# Patient Record
Sex: Female | Born: 1945 | Race: White | Hispanic: No | Smoking: Former smoker
Health system: Southern US, Community
[De-identification: ages and names within clinical notes are randomized; demographics above are authoritative.]

## PROBLEM LIST (undated history)

## (undated) DIAGNOSIS — E079 Disorder of thyroid, unspecified: Secondary | ICD-10-CM

## (undated) DIAGNOSIS — E785 Hyperlipidemia, unspecified: Secondary | ICD-10-CM

---

## 2000-04-28 ENCOUNTER — Other Ambulatory Visit: Admission: RE | Admit: 2000-04-28 | Discharge: 2000-04-28 | Payer: Self-pay | Admitting: Family Medicine

## 2000-11-26 ENCOUNTER — Other Ambulatory Visit: Admission: RE | Admit: 2000-11-26 | Discharge: 2000-11-26 | Payer: Self-pay | Admitting: Obstetrics and Gynecology

## 2000-11-29 ENCOUNTER — Other Ambulatory Visit: Admission: RE | Admit: 2000-11-29 | Discharge: 2000-11-29 | Payer: Self-pay | Admitting: Obstetrics and Gynecology

## 2000-11-29 ENCOUNTER — Encounter (INDEPENDENT_AMBULATORY_CARE_PROVIDER_SITE_OTHER): Payer: Self-pay

## 2000-12-08 ENCOUNTER — Encounter: Payer: Self-pay | Admitting: Obstetrics and Gynecology

## 2000-12-08 ENCOUNTER — Encounter: Admission: RE | Admit: 2000-12-08 | Discharge: 2000-12-08 | Payer: Self-pay | Admitting: Obstetrics and Gynecology

## 2001-05-04 ENCOUNTER — Other Ambulatory Visit: Admission: RE | Admit: 2001-05-04 | Discharge: 2001-05-04 | Payer: Self-pay | Admitting: *Deleted

## 2001-05-04 ENCOUNTER — Other Ambulatory Visit: Admission: RE | Admit: 2001-05-04 | Discharge: 2001-05-04 | Payer: Self-pay | Admitting: Obstetrics and Gynecology

## 2001-10-06 ENCOUNTER — Other Ambulatory Visit: Admission: RE | Admit: 2001-10-06 | Discharge: 2001-10-06 | Payer: Self-pay | Admitting: Obstetrics and Gynecology

## 2001-12-12 ENCOUNTER — Encounter: Admission: RE | Admit: 2001-12-12 | Discharge: 2001-12-12 | Payer: Self-pay | Admitting: Obstetrics and Gynecology

## 2001-12-12 ENCOUNTER — Encounter: Payer: Self-pay | Admitting: Obstetrics and Gynecology

## 2002-04-10 ENCOUNTER — Other Ambulatory Visit: Admission: RE | Admit: 2002-04-10 | Discharge: 2002-04-10 | Payer: Self-pay | Admitting: Obstetrics and Gynecology

## 2002-10-09 ENCOUNTER — Other Ambulatory Visit: Admission: RE | Admit: 2002-10-09 | Discharge: 2002-10-09 | Payer: Self-pay | Admitting: Obstetrics and Gynecology

## 2003-10-04 ENCOUNTER — Other Ambulatory Visit: Admission: RE | Admit: 2003-10-04 | Discharge: 2003-10-04 | Payer: Self-pay | Admitting: Obstetrics and Gynecology

## 2003-10-23 ENCOUNTER — Encounter: Admission: RE | Admit: 2003-10-23 | Discharge: 2003-10-23 | Payer: Self-pay | Admitting: Obstetrics and Gynecology

## 2004-08-06 ENCOUNTER — Encounter: Admission: RE | Admit: 2004-08-06 | Discharge: 2004-08-06 | Payer: Self-pay | Admitting: Emergency Medicine

## 2004-10-06 ENCOUNTER — Other Ambulatory Visit: Admission: RE | Admit: 2004-10-06 | Discharge: 2004-10-06 | Payer: Self-pay | Admitting: Obstetrics and Gynecology

## 2004-11-04 ENCOUNTER — Encounter: Admission: RE | Admit: 2004-11-04 | Discharge: 2004-11-04 | Payer: Self-pay | Admitting: Obstetrics and Gynecology

## 2004-11-24 ENCOUNTER — Encounter: Admission: RE | Admit: 2004-11-24 | Discharge: 2004-11-24 | Payer: Self-pay | Admitting: Obstetrics and Gynecology

## 2005-10-12 ENCOUNTER — Other Ambulatory Visit: Admission: RE | Admit: 2005-10-12 | Discharge: 2005-10-12 | Payer: Self-pay | Admitting: Obstetrics & Gynecology

## 2005-12-07 ENCOUNTER — Encounter: Admission: RE | Admit: 2005-12-07 | Discharge: 2005-12-07 | Payer: Self-pay | Admitting: Obstetrics and Gynecology

## 2006-10-19 ENCOUNTER — Other Ambulatory Visit: Admission: RE | Admit: 2006-10-19 | Discharge: 2006-10-19 | Payer: Self-pay | Admitting: Obstetrics and Gynecology

## 2006-12-13 ENCOUNTER — Encounter: Admission: RE | Admit: 2006-12-13 | Discharge: 2006-12-13 | Payer: Self-pay | Admitting: Obstetrics and Gynecology

## 2006-12-21 ENCOUNTER — Encounter: Admission: RE | Admit: 2006-12-21 | Discharge: 2006-12-21 | Payer: Self-pay | Admitting: Obstetrics and Gynecology

## 2007-10-25 ENCOUNTER — Other Ambulatory Visit: Admission: RE | Admit: 2007-10-25 | Discharge: 2007-10-25 | Payer: Self-pay | Admitting: Obstetrics and Gynecology

## 2007-12-19 ENCOUNTER — Encounter: Admission: RE | Admit: 2007-12-19 | Discharge: 2007-12-19 | Payer: Self-pay | Admitting: Obstetrics and Gynecology

## 2008-12-24 ENCOUNTER — Encounter: Admission: RE | Admit: 2008-12-24 | Discharge: 2008-12-24 | Payer: Self-pay | Admitting: Obstetrics and Gynecology

## 2010-01-02 ENCOUNTER — Encounter: Admission: RE | Admit: 2010-01-02 | Discharge: 2010-01-02 | Payer: Self-pay | Admitting: Obstetrics and Gynecology

## 2010-06-22 ENCOUNTER — Encounter: Payer: Self-pay | Admitting: Obstetrics and Gynecology

## 2010-12-05 ENCOUNTER — Other Ambulatory Visit: Payer: Self-pay | Admitting: Obstetrics and Gynecology

## 2010-12-05 DIAGNOSIS — Z1231 Encounter for screening mammogram for malignant neoplasm of breast: Secondary | ICD-10-CM

## 2011-01-05 ENCOUNTER — Ambulatory Visit
Admission: RE | Admit: 2011-01-05 | Discharge: 2011-01-05 | Disposition: A | Discharge status: Home / Self Care | Payer: Medicare Other | Source: Ambulatory Visit | Attending: Obstetrics and Gynecology | Admitting: Obstetrics and Gynecology

## 2011-01-05 DIAGNOSIS — Z1231 Encounter for screening mammogram for malignant neoplasm of breast: Secondary | ICD-10-CM

## 2013-03-14 ENCOUNTER — Other Ambulatory Visit: Payer: Self-pay | Admitting: Obstetrics and Gynecology

## 2013-03-14 ENCOUNTER — Other Ambulatory Visit (HOSPITAL_COMMUNITY)
Admission: RE | Admit: 2013-03-14 | Discharge: 2013-03-14 | Disposition: A | Discharge status: Home / Self Care | Payer: Medicare Other | Source: Ambulatory Visit | Attending: Obstetrics and Gynecology | Admitting: Obstetrics and Gynecology

## 2013-03-14 DIAGNOSIS — Z1151 Encounter for screening for human papillomavirus (HPV): Secondary | ICD-10-CM | POA: Insufficient documentation

## 2013-03-14 DIAGNOSIS — Z01419 Encounter for gynecological examination (general) (routine) without abnormal findings: Secondary | ICD-10-CM | POA: Insufficient documentation

## 2013-03-22 ENCOUNTER — Other Ambulatory Visit: Payer: Self-pay | Admitting: Obstetrics and Gynecology

## 2014-05-03 ENCOUNTER — Encounter: Payer: Self-pay | Admitting: Emergency Medicine

## 2014-05-03 ENCOUNTER — Emergency Department
Admission: EM | Admit: 2014-05-03 | Discharge: 2014-05-03 | Disposition: A | Discharge status: Home / Self Care | Payer: Federal, State, Local not specified - PPO | Source: Home / Self Care | Attending: Family Medicine | Admitting: Family Medicine

## 2014-05-03 DIAGNOSIS — J029 Acute pharyngitis, unspecified: Secondary | ICD-10-CM

## 2014-05-03 HISTORY — DX: Hyperlipidemia, unspecified: E78.5

## 2014-05-03 HISTORY — DX: Disorder of thyroid, unspecified: E07.9

## 2014-05-03 MED ORDER — IPRATROPIUM BROMIDE 0.06 % NA SOLN: 2.0000 | Freq: Four times a day (QID) | NASAL | Status: DC

## 2014-05-03 MED ORDER — PREDNISONE 10 MG PO TABS: 30.0000 mg | ORAL_TABLET | Freq: Every day | ORAL | Status: DC

## 2014-05-03 NOTE — Discharge Instructions (Signed)
Thank you for coming in today. Use Atrovent nasal spray and continue Tylenol or ibuprofen for cold symptoms. Take prednisone if you are miserable or not getting better. Follow-up with primary care doctor.  Pharyngitis Pharyngitis is redness, pain, and swelling (inflammation) of your pharynx.  CAUSES  Pharyngitis is usually caused by infection. Most of the time, these infections are from viruses (viral) and are part of a cold. However, sometimes pharyngitis is caused by bacteria (bacterial). Pharyngitis can also be caused by allergies. Viral pharyngitis may be spread from person to person by coughing, sneezing, and personal items or utensils (cups, forks, spoons, toothbrushes). Bacterial pharyngitis may be spread from person to person by more intimate contact, such as kissing.  SIGNS AND SYMPTOMS  Symptoms of pharyngitis include:   Sore throat.   Tiredness (fatigue).   Low-grade fever.   Headache.  Joint pain and muscle aches.  Skin rashes.  Swollen lymph nodes.  Plaque-like film on throat or tonsils (often seen with bacterial pharyngitis). DIAGNOSIS  Your health care provider will ask you questions about your illness and your symptoms. Your medical history, along with a physical exam, is often all that is needed to diagnose pharyngitis. Sometimes, a rapid strep test is done. Other lab tests may also be done, depending on the suspected cause.  TREATMENT  Viral pharyngitis will usually get better in 3-4 days without the use of medicine. Bacterial pharyngitis is treated with medicines that kill germs (antibiotics).  HOME CARE INSTRUCTIONS   Drink enough water and fluids to keep your urine clear or pale yellow.   Only take over-the-counter or prescription medicines as directed by your health care provider:   If you are prescribed antibiotics, make sure you finish them even if you start to feel better.   Do not take aspirin.   Get lots of rest.   Gargle with 8 oz of  salt water ( tsp of salt per 1 qt of water) as often as every 1-2 hours to soothe your throat.   Throat lozenges (if you are not at risk for choking) or sprays may be used to soothe your throat. SEEK MEDICAL CARE IF:   You have large, tender lumps in your neck.  You have a rash.  You cough up green, yellow-brown, or bloody spit. SEEK IMMEDIATE MEDICAL CARE IF:   Your neck becomes stiff.  You drool or are unable to swallow liquids.  You vomit or are unable to keep medicines or liquids down.  You have severe pain that does not go away with the use of recommended medicines.  You have trouble breathing (not caused by a stuffy nose). MAKE SURE YOU:   Understand these instructions.  Will watch your condition.  Will get help right away if you are not doing well or get worse. Document Released: 05/18/2005 Document Revised: 03/08/2013 Document Reviewed: 01/23/2013 [facility] Patient Information 2015 [place], [place]. This information is not intended to replace advice given to you by your health care provider. Make sure you discuss any questions you have with your health care provider.

## 2014-05-03 NOTE — ED Notes (Signed)
Reports 5 days of congestion, cough, sore throat; no fever.

## 2014-05-03 NOTE — ED Provider Notes (Signed)
Gabriela Baker is a 68 y.o. female who presents to Urgent Care today for cough congestion and sore throat. Symptom has a five-day history. Additionally she notes runny nose. No fevers or chills shortness of breath vomiting or diarrhea. She's tried Advil which helps.   Past Medical History  Diagnosis Date  . Thyroid disease   . Hyperlipidemia    History reviewed. No pertinent past surgical history. History  Substance Use Topics  . Smoking status: Current Every Day Smoker  . Smokeless tobacco: Not on file  . Alcohol Use: Yes   ROS as above Medications: No current facility-administered medications for this encounter.   Current Outpatient Prescriptions  Medication Sig Dispense Refill  . atorvastatin (LIPITOR) 10 MG tablet Take 10 mg by mouth daily.    [name] levothyroxine (SYNTHROID, LEVOTHROID) 50 MCG tablet Take 50 mcg by mouth daily before breakfast.    . ipratropium (ATROVENT) 0.06 % nasal spray Place 2 sprays into both nostrils 4 (four) times daily. 15 mL 1  . predniSONE (DELTASONE) 10 MG tablet Take 3 tablets (30 mg total) by mouth daily. 15 tablet 0   Allergies  Allergen Reactions  . Bee Venom      Exam:  BP 118/77 mmHg  Pulse 109  Temp(Src) 98 F (36.7 C) (Oral)  Resp 16  Ht 5\' 4"  (1.626 m)  Wt 149 lb (67.586 kg)  BMI 25.56 kg/m2  SpO2 96% Gen: Well NAD HEENT: EOMI,  MMM posterior pharynx with cobblestoning. Normal tympanic membranes bilaterally. No exudates on tonsils bilaterally. Lungs: Normal work of breathing. CTABL Heart: RRR no MRG Abd: NABS, Soft. Nondistended, Nontender Exts: Brisk capillary refill, warm and well perfused.   No results found for this or any previous visit (from the past 24 hour(s)). No results found.  Assessment and Plan: 68 y.o. female with viral pharyngitis. Discussed options. Continue Tylenol and ibuprofen. Additionally we'll treat with Atrovent nasal spray. Discussed risks and benefits of prednisone. Will use only if patient worsens  or she feels miserable. Recommended against taking at this time. Follow-up with PCP.  Discussed warning signs or symptoms. Please see discharge instructions. Patient expresses understanding.     Gregor Hams, MD 05/03/14 1009

## 2014-05-09 ENCOUNTER — Emergency Department (INDEPENDENT_AMBULATORY_CARE_PROVIDER_SITE_OTHER): Payer: Medicare Other

## 2014-05-09 ENCOUNTER — Encounter: Payer: Self-pay | Admitting: Emergency Medicine

## 2014-05-09 ENCOUNTER — Emergency Department
Admission: EM | Admit: 2014-05-09 | Discharge: 2014-05-09 | Disposition: A | Discharge status: Home / Self Care | Payer: Federal, State, Local not specified - PPO | Source: Home / Self Care | Attending: Family Medicine | Admitting: Family Medicine

## 2014-05-09 DIAGNOSIS — R053 Chronic cough: Secondary | ICD-10-CM

## 2014-05-09 DIAGNOSIS — R05 Cough: Secondary | ICD-10-CM

## 2014-05-09 DIAGNOSIS — R0781 Pleurodynia: Secondary | ICD-10-CM

## 2014-05-09 DIAGNOSIS — R0989 Other specified symptoms and signs involving the circulatory and respiratory systems: Secondary | ICD-10-CM

## 2014-05-09 MED ORDER — BENZONATATE 200 MG PO CAPS: 200.0000 mg | ORAL_CAPSULE | Freq: Every day | ORAL | Status: DC

## 2014-05-09 MED ORDER — AZITHROMYCIN 250 MG PO TABS: ORAL_TABLET | ORAL | Status: DC

## 2014-05-09 NOTE — Discharge Instructions (Signed)
Take plain Mucinex (1200 mg guaifenesin) twice daily for cough and congestion.  May add Sudafed for sinus congestion.   Increase fluid intake, rest. May use Afrin nasal spray (or generic oxymetazoline) twice daily for about 5 days.  Also recommend using saline nasal spray several times daily and saline nasal irrigation (AYR is a common brand) Stop all antihistamines for now, and other non-prescription cough/cold preparations. Follow-up with family doctor if not improving 7 to 10 days.

## 2014-05-09 NOTE — ED Notes (Signed)
Unresolved cough and now rib pain on right side.

## 2014-05-09 NOTE — ED Provider Notes (Signed)
CSN: [id number]     Arrival date & time 05/09/14  1054 History   First MD Initiated Contact with Patient 05/09/14 1118     Chief Complaint  Patient presents with  . Cough  . Chest Pain  . Hoarse      HPI Comments: Patient reports that she developed chest and sinus congestion about 9 days ago, and four days ago she developed a non-productive cough.  Over the past two days she has had pleuritic pain in her right lateral chest.  No fevers, chills, and sweats.  No shortness of breath   The history is provided by the patient.    Past Medical History  Diagnosis Date  . Thyroid disease   . Hyperlipidemia    History reviewed. No pertinent past surgical history. History reviewed. No pertinent family history. History  Substance Use Topics  . Smoking status: Current Every Day Smoker  . Smokeless tobacco: Not on file  . Alcohol Use: Yes   OB History    No data available     Review of Systems No sore throat + hoarse + cough + pleuritic pain No wheezing + nasal congestion + post-nasal drainage No sinus pain/pressure No itchy/red eyes No earache No hemoptysis No SOB No fever/chills No nausea No vomiting No abdominal pain No diarrhea No urinary symptoms No skin rash + fatigue No myalgias No headache Used OTC meds without relief  Allergies  Bee venom  Home Medications   Prior to Admission medications   Medication Sig Start Date End Date Taking? Authorizing Provider  atorvastatin (LIPITOR) 10 MG tablet Take 10 mg by mouth daily.    Historical Provider, MD  azithromycin (ZITHROMAX Z-PAK) 250 MG tablet Take 2 tabs today; then begin one tab once daily for 4 more days. 05/09/14   Kandra Nicolas, MD  benzonatate (TESSALON) 200 MG capsule Take 1 capsule (200 mg total) by mouth at bedtime. Take as needed for cough 05/09/14   Kandra Nicolas, MD  ipratropium (ATROVENT) 0.06 % nasal spray Place 2 sprays into both nostrils 4 (four) times daily. 05/03/14   Gregor Hams, MD   levothyroxine (SYNTHROID, LEVOTHROID) 50 MCG tablet Take 50 mcg by mouth daily before breakfast.    Historical Provider, MD  predniSONE (DELTASONE) 10 MG tablet Take 3 tablets (30 mg total) by mouth daily. 05/03/14   Gregor Hams, MD   BP 131/83 mmHg  Pulse 98  Temp(Src) 98.3 F (36.8 C) (Oral)  Resp 18  SpO2 95% Physical Exam Nursing notes and Vital Signs reviewed. Appearance:  Patient appears stated age, and in no acute distress Eyes:  Pupils are equal, round, and reactive to light and accomodation.  Extraocular movement is intact.  Conjunctivae are not inflamed  Ears:  Canals normal.  Tympanic membranes normal.  Nose:  Mildly congested turbinates.  No sinus tenderness.  Pharynx:  Normal Neck:  Supple.  Slightly tender shotty posterior nodes are palpated bilaterally  Lungs:  Clear to auscultation.  Breath sounds are equal.  Chest:  Mild diffuse tenderness right lateral chest. Heart:  Regular rate and rhythm without murmurs, rubs, or gallops.  Abdomen:  Nontender without masses or hepatosplenomegaly.  Bowel sounds are present.  No CVA or flank tenderness.  Extremities:  No edema.  No calf tenderness Skin:  No rash present.   ED Course  Procedures  none  Imaging Review Dg Chest 2 View  05/09/2014   CLINICAL DATA:  Cough, congestion for a week.  EXAM: CHEST  2 VIEW  COMPARISON:  02/08/2013  FINDINGS: The heart size and mediastinal contours are within normal limits. Both lungs are clear. The visualized skeletal structures are unremarkable.  IMPRESSION: No active cardiopulmonary disease.   Electronically Signed   By: Rolm Baptise M.D.   On: 05/09/2014 12:03   Dg Ribs Unilateral Right  05/09/2014   CLINICAL DATA:  Cough, congestion for a week. Right side rib pain beginning Saturday.  EXAM: RIGHT RIBS - 2 VIEW  COMPARISON:  05/09/2014 chest x-ray.  Chest x-ray 02/08/2013.  FINDINGS: Visualized right lung is clear. No effusion or pneumothorax. No visible rib fracture.  IMPRESSION: No acute  findings.   Electronically Signed   By: Rolm Baptise M.D.   On: 05/09/2014 12:01     MDM   1. Rib pain on right side   2. Persistent cough    Begin Z-pack to cover atypicals.  Prescription written for Benzonatate [facility]) to take at bedtime for night-time cough.   Take plain Mucinex (1200 mg guaifenesin) twice daily for cough and congestion.  May add Sudafed for sinus congestion.   Increase fluid intake, rest. May use Afrin nasal spray (or generic oxymetazoline) twice daily for about 5 days.  Also recommend using saline nasal spray several times daily and saline nasal irrigation (AYR is a common brand) Stop all antihistamines for now, and other non-prescription cough/cold preparations. Follow-up with family doctor if not improving 7 to 10 days.    Kandra Nicolas, MD 05/11/14 [phone]

## 2015-04-15 ENCOUNTER — Encounter: Payer: Self-pay | Admitting: Physician Assistant

## 2015-04-15 ENCOUNTER — Ambulatory Visit (INDEPENDENT_AMBULATORY_CARE_PROVIDER_SITE_OTHER): Payer: Medicare Other | Admitting: Physician Assistant

## 2015-04-15 VITALS — BP 120/60 | HR 100 | Ht 64.0 in | Wt 142.0 lb

## 2015-04-15 DIAGNOSIS — Z87892 Personal history of anaphylaxis: Secondary | ICD-10-CM

## 2015-04-15 DIAGNOSIS — F17200 Nicotine dependence, unspecified, uncomplicated: Secondary | ICD-10-CM | POA: Insufficient documentation

## 2015-04-15 DIAGNOSIS — E785 Hyperlipidemia, unspecified: Secondary | ICD-10-CM | POA: Diagnosis not present

## 2015-04-15 DIAGNOSIS — Z780 Asymptomatic menopausal state: Secondary | ICD-10-CM | POA: Insufficient documentation

## 2015-04-15 DIAGNOSIS — Z23 Encounter for immunization: Secondary | ICD-10-CM

## 2015-04-15 DIAGNOSIS — M8580 Other specified disorders of bone density and structure, unspecified site: Secondary | ICD-10-CM | POA: Insufficient documentation

## 2015-04-15 DIAGNOSIS — E039 Hypothyroidism, unspecified: Secondary | ICD-10-CM

## 2015-04-15 DIAGNOSIS — Z1231 Encounter for screening mammogram for malignant neoplasm of breast: Secondary | ICD-10-CM

## 2015-04-15 MED ORDER — EPINEPHRINE 0.3 MG/0.3ML IJ SOAJ: 0.3000 mg | Freq: Once | INTRAMUSCULAR | Status: DC

## 2015-04-15 NOTE — Progress Notes (Signed)
   Subjective:    Patient ID: Gabriela Baker, female    DOB: 08/10/1945, 69 y.o.   MRN: [id number]  HPI Patient is a 69 year old female who presents to the clinic to establish care. She recently moved to [place] from [place]. She does not need any refills on medications today. She is in need of some health maintenance items to be updated.  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 The last EpiPen was very expensive and she is looking into other options.  .. Active Ambulatory Problems    Diagnosis Date Noted  . History of anaphylaxis 04/15/2015  . Thyroid activity decreased 04/15/2015  . Tobacco dependence 04/15/2015  . Hyperlipidemia 04/15/2015   Resolved Ambulatory Problems    Diagnosis Date Noted  . No Resolved Ambulatory Problems   Past Medical History  Diagnosis Date  . Thyroid disease    .Marland KitchenHistory reviewed. No pertinent family history. .. Social History   Social History  . Marital Status: Divorced    Spouse Name: N/A  . Number of Children: N/A  . Years of Education: N/A   Occupational History  . Not on file.   Social History Main Topics  . Smoking status: Current Every Day Smoker  . Smokeless tobacco: Not on file  . Alcohol Use: Yes  . Drug Use: No  . Sexual Activity: No   Other Topics Concern  . Not on file   Social History Narrative      Review of Systems  All other systems reviewed and are negative.      Objective:   Physical Exam  Constitutional: She is oriented to person, place, and time. She appears well-developed and well-nourished.  HENT:  Head: Normocephalic and atraumatic.  Cardiovascular: Regular rhythm and normal heart sounds.   Tachycardia at 100 and rechecked in office.   Pulmonary/Chest: Effort normal and breath sounds normal.  Neurological: She is alert and oriented to person, place, and time.  Skin: Skin is dry.  Psychiatric: She has a normal mood and affect. Her behavior is normal.           Assessment & Plan:  Hyperlipidemia-patient on Lipitor. Does not need refills today. Recheck when needing refills.  Hypothyroidism-no adjustments made today continue on same dose until running out of medication and we'll recheck labs.  Tobacco dependence-discussed need for smoking cessation. Patient is not willing or ready to quit.  History of anaphylaxis to bee stings- Adrenalclick was sent to the pharmacy.  Patient is in need of mammogram and bone density screening. We'll put those orders in today.  Patient would like to hold on tetanus shot today since she is getting her flu shot and pneumonia vaccine today.  Medication where we need a complete physical in the next 6 months. Briefly discuss colonoscopy. She declines colonoscopy but is interested in: Card. She reports she did do stool cards earlier this year that were negative.

## 2015-07-20 ENCOUNTER — Encounter: Payer: Self-pay | Admitting: Emergency Medicine

## 2015-07-20 ENCOUNTER — Emergency Department (INDEPENDENT_AMBULATORY_CARE_PROVIDER_SITE_OTHER)
Admission: EM | Admit: 2015-07-20 | Discharge: 2015-07-20 | Disposition: A | Discharge status: Home / Self Care | Payer: Federal, State, Local not specified - PPO | Source: Home / Self Care | Attending: Family Medicine | Admitting: Family Medicine

## 2015-07-20 DIAGNOSIS — J069 Acute upper respiratory infection, unspecified: Secondary | ICD-10-CM | POA: Diagnosis not present

## 2015-07-20 DIAGNOSIS — B9789 Other viral agents as the cause of diseases classified elsewhere: Principal | ICD-10-CM

## 2015-07-20 MED ORDER — BENZONATATE 200 MG PO CAPS: 200.0000 mg | ORAL_CAPSULE | Freq: Every day | ORAL | Status: DC

## 2015-07-20 MED ORDER — DOXYCYCLINE HYCLATE 100 MG PO CAPS: 100.0000 mg | ORAL_CAPSULE | Freq: Two times a day (BID) | ORAL | Status: DC

## 2015-07-20 NOTE — Discharge Instructions (Signed)
Take plain guaifenesin (1200mg  extended release tabs such as Mucinex) twice daily, with plenty of water, for cough and congestion.  May add Pseudoephedrine (30mg , one or two every 4 to 6 hours) for sinus congestion.  Get adequate rest.   May use Afrin nasal spray (or generic oxymetazoline) twice daily for about 5 days and then discontinue.  Also recommend using saline nasal spray several times daily and saline nasal irrigation (AYR is a common brand).   Try warm salt water gargles for sore throat.  May continue albuterol inhaler as needed. Stop all antihistamines for now, and other non-prescription cough/cold preparations. Begin Doxycycline if not improving about one week or if persistent fever develops   Follow-up with family doctor if not improving about10 days.

## 2015-07-20 NOTE — ED Provider Notes (Signed)
CSN: [id number]     Arrival date & time 07/20/15  1254 History   First MD Initiated Contact with Patient 07/20/15 1422     Chief Complaint  Patient presents with  . Cough  . Nasal Congestion  . Sore Throat      HPI Comments: Patient complains of four day history of typical cold-like symptoms including mild sore throat, sinus congestion, fatigue, and cough.   The history is provided by the patient.    Past Medical History  Diagnosis Date  . Thyroid disease   . Hyperlipidemia    History reviewed. No pertinent past surgical history. History reviewed. No pertinent family history. Social History  Substance Use Topics  . Smoking status: Current Every Day Smoker  . Smokeless tobacco: None  . Alcohol Use: Yes   OB History    No data available     Review of Systems + sore throat + cough + sneezing No pleuritic pain No wheezing + nasal congestion + post-nasal drainage No sinus pain/pressure No itchy/red eyes No earache No hemoptysis No SOB No fever/chills No nausea No vomiting No abdominal pain No diarrhea No urinary symptoms No skin rash + fatigue No myalgias No headache Used OTC meds without relief  Allergies  Bee venom  Home Medications   Prior to Admission medications   Medication Sig Start Date End Date Taking? Authorizing Provider  albuterol (PROVENTIL HFA;VENTOLIN HFA) 108 (90 BASE) MCG/ACT inhaler Inhale 2 puffs into the lungs as needed for wheezing or shortness of breath.    Historical Provider, MD  atorvastatin (LIPITOR) 10 MG tablet Take 10 mg by mouth daily.    Historical Provider, MD  benzonatate (TESSALON) 200 MG capsule Take 1 capsule (200 mg total) by mouth at bedtime. Take as needed for cough 07/20/15   Kandra Nicolas, MD  Calcium Citrate-Vitamin D (CALCIUM + D PO) Take by mouth daily.    Historical Provider, MD  doxycycline (VIBRAMYCIN) 100 MG capsule Take 1 capsule (100 mg total) by mouth 2 (two) times daily. Take with food (Rx void after  07/28/15) 07/20/15   Kandra Nicolas, MD  EPINEPHrine 0.3 mg/0.3 mL IJ SOAJ injection Inject 0.3 mLs (0.3 mg total) into the muscle once. Patient taking differently: Inject 0.3 mg into the muscle once.  04/15/15   Jade L Breeback, PA-C  levothyroxine (SYNTHROID, LEVOTHROID) 50 MCG tablet Take 50 mcg by mouth daily before breakfast.    Historical Provider, MD  Multiple Vitamins-Minerals (CENTRUM SILVER PO) Take by mouth daily.    Historical Provider, MD   Meds Ordered and Administered this Visit  Medications - No data to display  BP 124/81 mmHg  Pulse 97  Temp(Src) 98.1 F (36.7 C) (Oral)  Resp 16  Ht 5' 4.5" (1.638 m)  Wt 137 lb (62.143 kg)  BMI 23.16 kg/m2  SpO2 97% No data found.   Physical Exam Nursing notes and Vital Signs reviewed. Appearance:  Patient appears stated age, and in no acute distress Eyes:  Pupils are equal, round, and reactive to light and accomodation.  Extraocular movement is intact.  Conjunctivae are not inflamed  Ears:  Canals normal.  Tympanic membranes normal.  Nose:  Mildly congested turbinates.  No sinus tenderness.   Pharynx:  Normal Neck:  Supple.  Tender enlarged posterior nodes are palpated bilaterally  Lungs:  Clear to auscultation.  Breath sounds are equal.  Moving air well. Heart:  Regular rate and rhythm without murmurs, rubs, or gallops.  Abdomen:  Nontender without  masses or hepatosplenomegaly.  Bowel sounds are present.  No CVA or flank tenderness.  Extremities:  No edema.  Skin:  No rash present.   ED Course  Procedures none   MDM   1. Viral URI with cough    There is no evidence of bacterial infection today.   Prescription written for Benzonatate [facility]) to take at bedtime for night-time cough.  Take plain guaifenesin (1200mg  extended release tabs such as Mucinex) twice daily, with plenty of water, for cough and congestion.  May add Pseudoephedrine (30mg , one or two every 4 to 6 hours) for sinus congestion.  Get adequate rest.   May  use Afrin nasal spray (or generic oxymetazoline) twice daily for about 5 days and then discontinue.  Also recommend using saline nasal spray several times daily and saline nasal irrigation (AYR is a common brand).   Try warm salt water gargles for sore throat.  May continue albuterol inhaler as needed. Stop all antihistamines for now, and other non-prescription cough/cold preparations. Begin Doxycycline if not improving about one week or if persistent fever develops (Given a prescription to hold, with an expiration date)  Follow-up with family doctor if not improving about10 days.     Kandra Nicolas, MD 07/23/15 [phone]

## 2015-07-20 NOTE — ED Notes (Signed)
Reports onset of cough, congestion and sore throat 2 days ago; denies fever or other aches. No OTCs since last night.

## 2015-10-04 ENCOUNTER — Encounter: Payer: Self-pay | Admitting: Physician Assistant

## 2015-10-04 ENCOUNTER — Ambulatory Visit (INDEPENDENT_AMBULATORY_CARE_PROVIDER_SITE_OTHER): Payer: Medicare Other | Admitting: Physician Assistant

## 2015-10-04 VITALS — BP 124/67 | HR 95 | Ht 64.5 in | Wt 142.0 lb

## 2015-10-04 DIAGNOSIS — D239 Other benign neoplasm of skin, unspecified: Secondary | ICD-10-CM

## 2015-10-04 DIAGNOSIS — Z1159 Encounter for screening for other viral diseases: Secondary | ICD-10-CM | POA: Diagnosis not present

## 2015-10-04 DIAGNOSIS — Z0000 Encounter for general adult medical examination without abnormal findings: Secondary | ICD-10-CM | POA: Diagnosis not present

## 2015-10-04 DIAGNOSIS — Z780 Asymptomatic menopausal state: Secondary | ICD-10-CM

## 2015-10-04 DIAGNOSIS — D229 Melanocytic nevi, unspecified: Secondary | ICD-10-CM | POA: Insufficient documentation

## 2015-10-04 DIAGNOSIS — E039 Hypothyroidism, unspecified: Secondary | ICD-10-CM

## 2015-10-04 DIAGNOSIS — Z1231 Encounter for screening mammogram for malignant neoplasm of breast: Secondary | ICD-10-CM

## 2015-10-04 DIAGNOSIS — Z13820 Encounter for screening for osteoporosis: Secondary | ICD-10-CM

## 2015-10-04 DIAGNOSIS — Z131 Encounter for screening for diabetes mellitus: Secondary | ICD-10-CM

## 2015-10-04 MED ORDER — HYDROCORTISONE 2.5 % RE CREA: 1.0000 "application " | TOPICAL_CREAM | Freq: Two times a day (BID) | RECTAL | Status: DC

## 2015-10-04 NOTE — Patient Instructions (Signed)

## 2015-10-04 NOTE — Progress Notes (Addendum)
Subjective:    Gabriela Baker is a 70 y.o. female who presents for Medicare Annual/Subsequent preventive examination.  Preventive Screening-Counseling & Management  Tobacco History  Smoking status  . Current Every Day Smoker  Smokeless tobacco  . Not on file     Current Problems (verified) Patient Active Problem List   Diagnosis Date Noted  . History of anaphylaxis 04/15/2015  . Thyroid activity decreased 04/15/2015  . Tobacco dependence 04/15/2015  . Hyperlipidemia 04/15/2015  . Osteopenia 04/15/2015  . Post-menopausal 04/15/2015    Medications Prior to Visit Current Outpatient Prescriptions on File Prior to Visit  Medication Sig Dispense Refill  . albuterol (PROVENTIL HFA;VENTOLIN HFA) 108 (90 BASE) MCG/ACT inhaler Inhale 2 puffs into the lungs as needed for wheezing or shortness of breath.    [name] atorvastatin (LIPITOR) 10 MG tablet Take 10 mg by mouth daily.    . benzonatate (TESSALON) 200 MG capsule Take 1 capsule (200 mg total) by mouth at bedtime. Take as needed for cough 12 capsule 0  . Calcium Citrate-Vitamin D (CALCIUM + D PO) Take by mouth daily.    [name] doxycycline (VIBRAMYCIN) 100 MG capsule Take 1 capsule (100 mg total) by mouth 2 (two) times daily. Take with food (Rx void after 07/28/15) 20 capsule 0  . EPINEPHrine 0.3 mg/0.3 mL IJ SOAJ injection Inject 0.3 mLs (0.3 mg total) into the muscle once. (Patient taking differently: Inject 0.3 mg into the muscle once. ) 1 Device 2  . levothyroxine (SYNTHROID, LEVOTHROID) 50 MCG tablet Take 50 mcg by mouth daily before breakfast.    . Multiple Vitamins-Minerals (CENTRUM SILVER PO) Take by mouth daily.     No current facility-administered medications on file prior to visit.    Current Medications (verified) Current Outpatient Prescriptions  Medication Sig Dispense Refill  . albuterol (PROVENTIL HFA;VENTOLIN HFA) 108 (90 BASE) MCG/ACT inhaler Inhale 2 puffs into the lungs as needed for wheezing or shortness of breath.     [name] atorvastatin (LIPITOR) 10 MG tablet Take 10 mg by mouth daily.    . benzonatate (TESSALON) 200 MG capsule Take 1 capsule (200 mg total) by mouth at bedtime. Take as needed for cough 12 capsule 0  . Calcium Citrate-Vitamin D (CALCIUM + D PO) Take by mouth daily.    [name] doxycycline (VIBRAMYCIN) 100 MG capsule Take 1 capsule (100 mg total) by mouth 2 (two) times daily. Take with food (Rx void after 07/28/15) 20 capsule 0  . EPINEPHrine 0.3 mg/0.3 mL IJ SOAJ injection Inject 0.3 mLs (0.3 mg total) into the muscle once. (Patient taking differently: Inject 0.3 mg into the muscle once. ) 1 Device 2  . levothyroxine (SYNTHROID, LEVOTHROID) 50 MCG tablet Take 50 mcg by mouth daily before breakfast.    . Multiple Vitamins-Minerals (CENTRUM SILVER PO) Take by mouth daily.     No current facility-administered medications for this visit.     Allergies (verified) Bee venom   PAST HISTORY  Family History No family history on file.  Social History Social History  Substance Use Topics  . Smoking status: Current Every Day Smoker  . Smokeless tobacco: Not on file  . Alcohol Use: Yes     Are there smokers in your home (other than you)? No  Risk Factors Current exercise habits: Gym/ health club routine includes swimming and walking on track .  Dietary issues discussed: None   Cardiac risk factors: advanced age (older than 64 for men, 73 for women) and dyslipidemia.  Depression Screen (Note:  if answer to either of the following is "Yes", a more complete depression screening is indicated)   Over the past two weeks, have you felt down, depressed or hopeless? No  Over the past two weeks, have you felt little interest or pleasure in doing things? No  Have you lost interest or pleasure in daily life? No  Do you often feel hopeless? No  Do you cry easily over simple problems? No  Activities of Daily Living In your present state of health, do you have any difficulty performing the following  activities?:  Driving? Yes Managing money?  No Feeding yourself? No Getting from bed to chair? NoBP 124/67 mmHg  Pulse 95  Ht 5' 4.5" (1.638 m)  Wt 142 lb (64.411 kg)  BMI 24.01 kg/m2                                                               Climbing a flight of stairs? No Preparing food and eating?: No Bathing or showering? No Getting dressed: No Getting to the toilet? No Using the toilet:No Moving around from place to place: No In the past year have you fallen or had a near fall?:No   Are you sexually active?  Yes  Do you have more than one partner?  No  Hearing Difficulties: Yes Do you often ask people to speak up or repeat themselves? Yes Do you experience ringing or noises in your ears? Yes Do you have difficulty understanding soft or whispered voices? No   Do you feel that you have a problem with memory? No  Do you often misplace items? Yes  Do you feel safe at home?  Yes  Cognitive Testing  Alert? Yes  Normal Appearance?Yes  Oriented to person? Yes  Place? Yes   Time? Yes  Recall of three objects?  Yes  Can perform simple calculations? Yes  Displays appropriate judgment?Yes  Can read the correct time from a watch face?Yes   Advanced Directives have been discussed with the patient? No   Indicate any recent Medical Services you may have received from other than [facility] providers in the past year (date may be approximate).  Immunization History  Administered Date(s) Administered  . Influenza,inj,Quad PF,36+ Mos 04/15/2015  . Pneumococcal Conjugate-13 04/15/2015    Screening Tests Health Maintenance  Topic Date Due  . Hepatitis C Screening  Dec 16, 1945  . TETANUS/TDAP  04/05/1964  . MAMMOGRAM  01/04/2013  . COLONOSCOPY  04/14/2016 (Originally 04/06/1995)  . INFLUENZA VACCINE  12/31/2015  . PNA vac Low Risk Adult (2 of 2 - PPSV23) 04/14/2016  . DEXA SCAN  Completed  . ZOSTAVAX  Addressed    All answers were reviewed with the  patient and necessary referrals were made:  Iran Planas, PA-C   10/04/2015   History reviewed: problem list  Review of Systems Pertinent items noted in HPI and remainder of comprehensive ROS otherwise negative.    Objective:     Vision by Snellen chart: right [id number] eye doctorly yearly, left [id number] eye doctor yearly  There is no weight on file to calculate BMI. There were no vitals taken for this visit.  BP 124/67 mmHg  Pulse 95  Ht 5' 4.5" (1.638 m)  Wt 142 lb (64.411 kg)  BMI 24.01 kg/m2  General Appearance:  Alert, cooperative, no distress, appears stated age  Head:    Normocephalic, without obvious abnormality, atraumatic  Eyes:    PERRL, conjunctiva/corneas clear, EOM's intact, fundi    benign, both eyes  Ears:    Normal TM's and external ear canals, both ears  Nose:   Nares normal, septum midline, mucosa normal, no drainage    or sinus tenderness  Throat:   Lips, mucosa, and tongue normal; teeth and gums normal  Neck:   Supple, symmetrical, trachea midline, no adenopathy;    thyroid:  no enlargement/tenderness/nodules; no carotid   bruit or JVD  Back:     Symmetric, no curvature, ROM normal, no CVA tenderness  Lungs:     Clear to auscultation bilaterally, respirations unlabored  Chest Wall:    No tenderness or deformity   Heart:    Regular rate and rhythm, S1 and S2 normal, no murmur, rub   or gallop     Abdomen:     Soft, non-tender, bowel sounds active all four quadrants,    no masses, no organomegaly     Rectal:    Normal tone, one external hemorrhoid present;   hemoccult negative   Extremities:   Extremities normal, atraumatic, no cyanosis or edema  Pulses:   2+ and symmetric all extremities  Skin:   Skin color, texture, turgor normal, no rashes. A 15mm atypical nevus was visualed to the right of the midline upper back.   Lymph nodes:   Cervical, supraclavicular, and axillary nodes normal  Neurologic:   CNII-XII intact, normal strength, sensation and  reflexes    throughout       Assessment:    This is a healthy-appearing 70 year old caucasian female with a history of hyperlipidemia and hypothyroidism.      Plan:  1. Hyperlipidemia- Patient has a history of hyperlipidemia and takes daily Lipitor. Will obtain lipid panel today.   2. Hypothyroidism- Patient has a history of hypothyroidism and takes daily Synthroid. She believes she is being well managed with this mediation. Will check thyroid function today with TSH.   3. Atypical Nevus- On PE, a 3 mm atypical nevus was visualized to the right of the midline on the upper back with abnormal pigmentation. Patient states she has never noticed this mole before and is therefore unsure if it has changed. Advised patient to return to have nevus biopsied.   4. Health Maintenance- Patient agreed to mammogram but would like to wait on Colonoscopy at this time due to transportation issues.bone density ordered as well. She takes vitamin d and calcium daily. She sees her eye doctor yearly and is scheduled for an appoint in 10/2015. Recommended dentist for patient as she has not been in over 3 years. Patient believes she received Td vaccine at another site within the last 10 years and therefore it was not given today. She will need pneumoccal 23 vaccine in November.   5. External hemorrhoids anusol cream given. Discussed prevention and warm sitz baths.    During the course of the visit the patient was educated and counseled about appropriate screening and preventive services including:    Td vaccine  Screening mammography  Colorectal cancer screening  Diet review for nutrition referral? Yes ____  Not Indicated _x___   Patient Instructions (the written plan) was given to the patient.  Medicare Attestation I have personally reviewed: The patient's medical and social history Their use of alcohol, tobacco or illicit drugs Their current medications and supplements The patient's functional  ability including ADLs,fall risks, home safety risks, cognitive, and hearing and visual impairment Diet and physical activities Evidence for depression or mood disorders  The patient's weight, height, BMI, and visual acuity have been recorded in the chart.  I have made referrals, counseling, and provided education to the patient based on review of the above and I have provided the patient with a written personalized care plan for preventive services.     Iran Planas, PA-C   10/04/2015

## 2015-10-08 ENCOUNTER — Encounter: Payer: Self-pay | Admitting: Physician Assistant

## 2015-10-08 DIAGNOSIS — E781 Pure hyperglyceridemia: Secondary | ICD-10-CM | POA: Insufficient documentation

## 2015-10-08 LAB — LIPID PANEL
CHOL/HDL RATIO: 3.6 ratio (ref <=5.0)
CHOLESTEROL: 222 mg/dL — AB (ref 125–200)
HDL: 62 mg/dL (ref >=46)
LDL CALC: 98 mg/dL (ref <130)
TRIGLYCERIDES: 311 mg/dL — AB (ref <150)
VLDL: 62 mg/dL — AB (ref <30)

## 2015-10-08 LAB — COMPLETE METABOLIC PANEL WITH GFR
ALBUMIN: 4.3 g/dL (ref 3.6–5.1)
ALK PHOS: 80 U/L (ref 33–130)
ALT: 38 U/L — AB (ref 6–29)
AST: 33 U/L (ref 10–35)
BILIRUBIN TOTAL: 0.3 mg/dL (ref 0.2–1.2)
BUN: 6 mg/dL — ABNORMAL LOW (ref 7–25)
CO2: 27 mmol/L (ref 20–31)
CREATININE: 0.58 mg/dL — AB (ref 0.60–0.93)
Calcium: 9.3 mg/dL (ref 8.6–10.4)
Chloride: 102 mmol/L (ref 98–110)
GLUCOSE: 92 mg/dL (ref 65–99)
Potassium: 4.6 mmol/L (ref 3.5–5.3)
SODIUM: 140 mmol/L (ref 135–146)
TOTAL PROTEIN: 6.7 g/dL (ref 6.1–8.1)

## 2015-10-08 LAB — TSH: TSH: 2.3 mIU/L

## 2015-10-08 LAB — VITAMIN D 25 HYDROXY (VIT D DEFICIENCY, FRACTURES): Vit D, 25-Hydroxy: 47 ng/mL (ref 30–100)

## 2015-10-08 LAB — HEPATITIS C ANTIBODY: HCV Ab: NEGATIVE

## 2015-10-22 IMAGING — CR DG RIBS 2V*R*
2 series · 2 of 2 positions shown · non-contrast
Comparison: 05/09/2014 chest x-ray.  Chest x-ray 02/08/2013.

CLINICAL DATA: Cough, congestion for a week. Right side rib pain
beginning [REDACTED].

EXAM:
RIGHT RIBS - 2 VIEW

[view not recorded (1 of 2)]
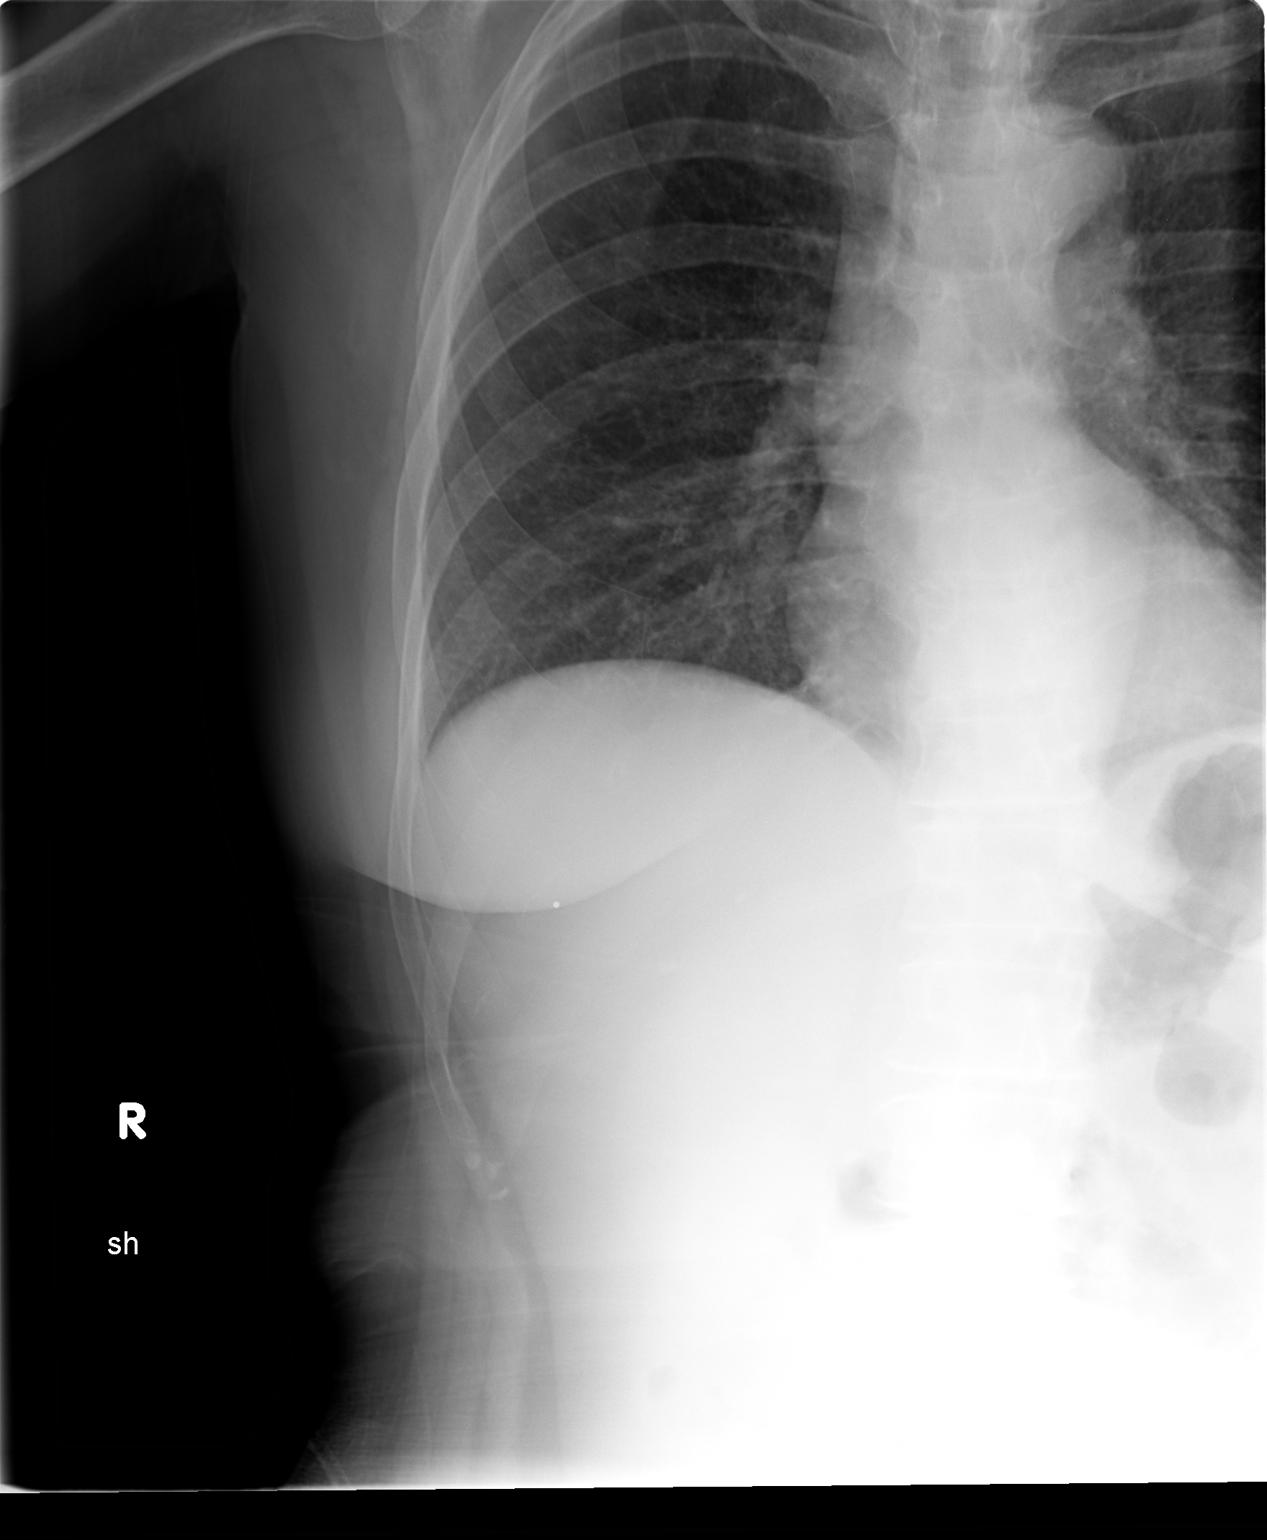

[view not recorded (2 of 2)]
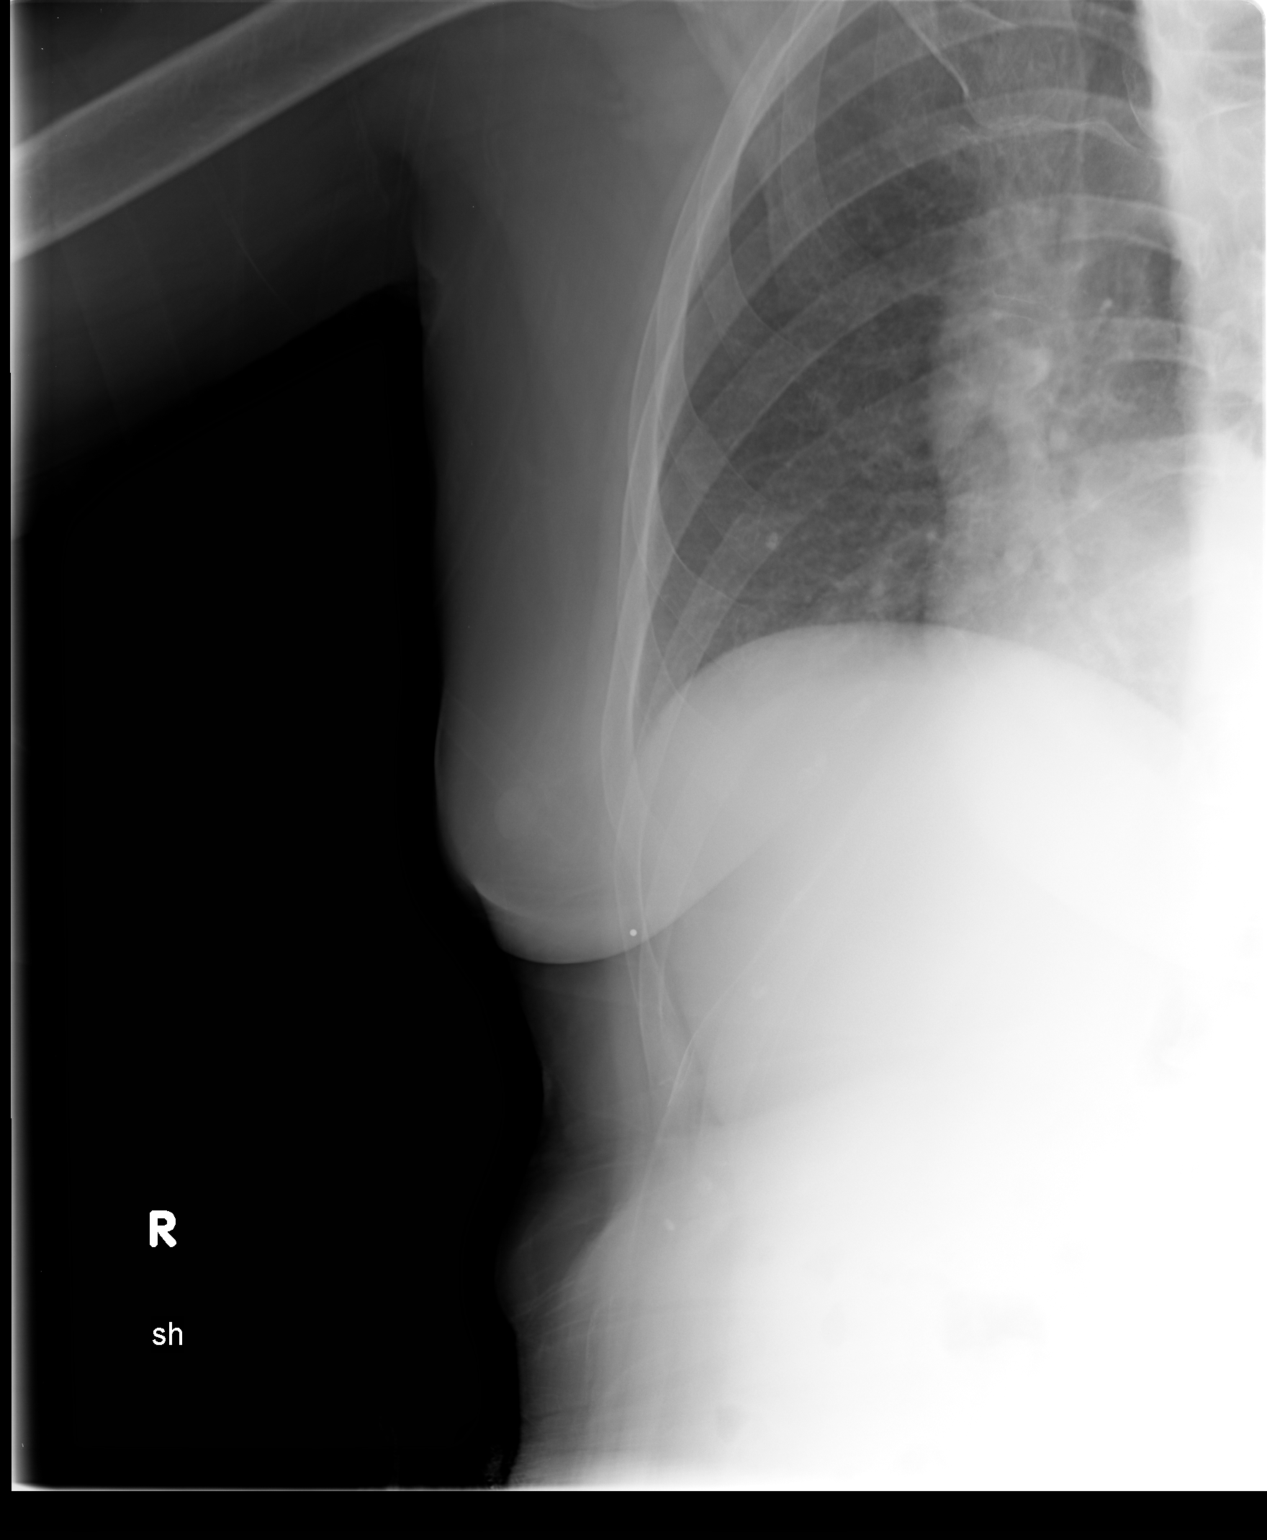

[2 of 2 positions shown; findings below may reference images not displayed]

FINDINGS: Visualized right lung is clear. No effusion or pneumothorax. No
visible rib fracture.
IMPRESSION: No acute findings.

## 2015-10-22 IMAGING — CR DG CHEST 2V
2 series · 2 of 2 positions shown · non-contrast
Comparison: 02/08/2013

CLINICAL DATA: Cough, congestion for a week.

EXAM:
CHEST  2 VIEW

[view not recorded (1 of 2)]
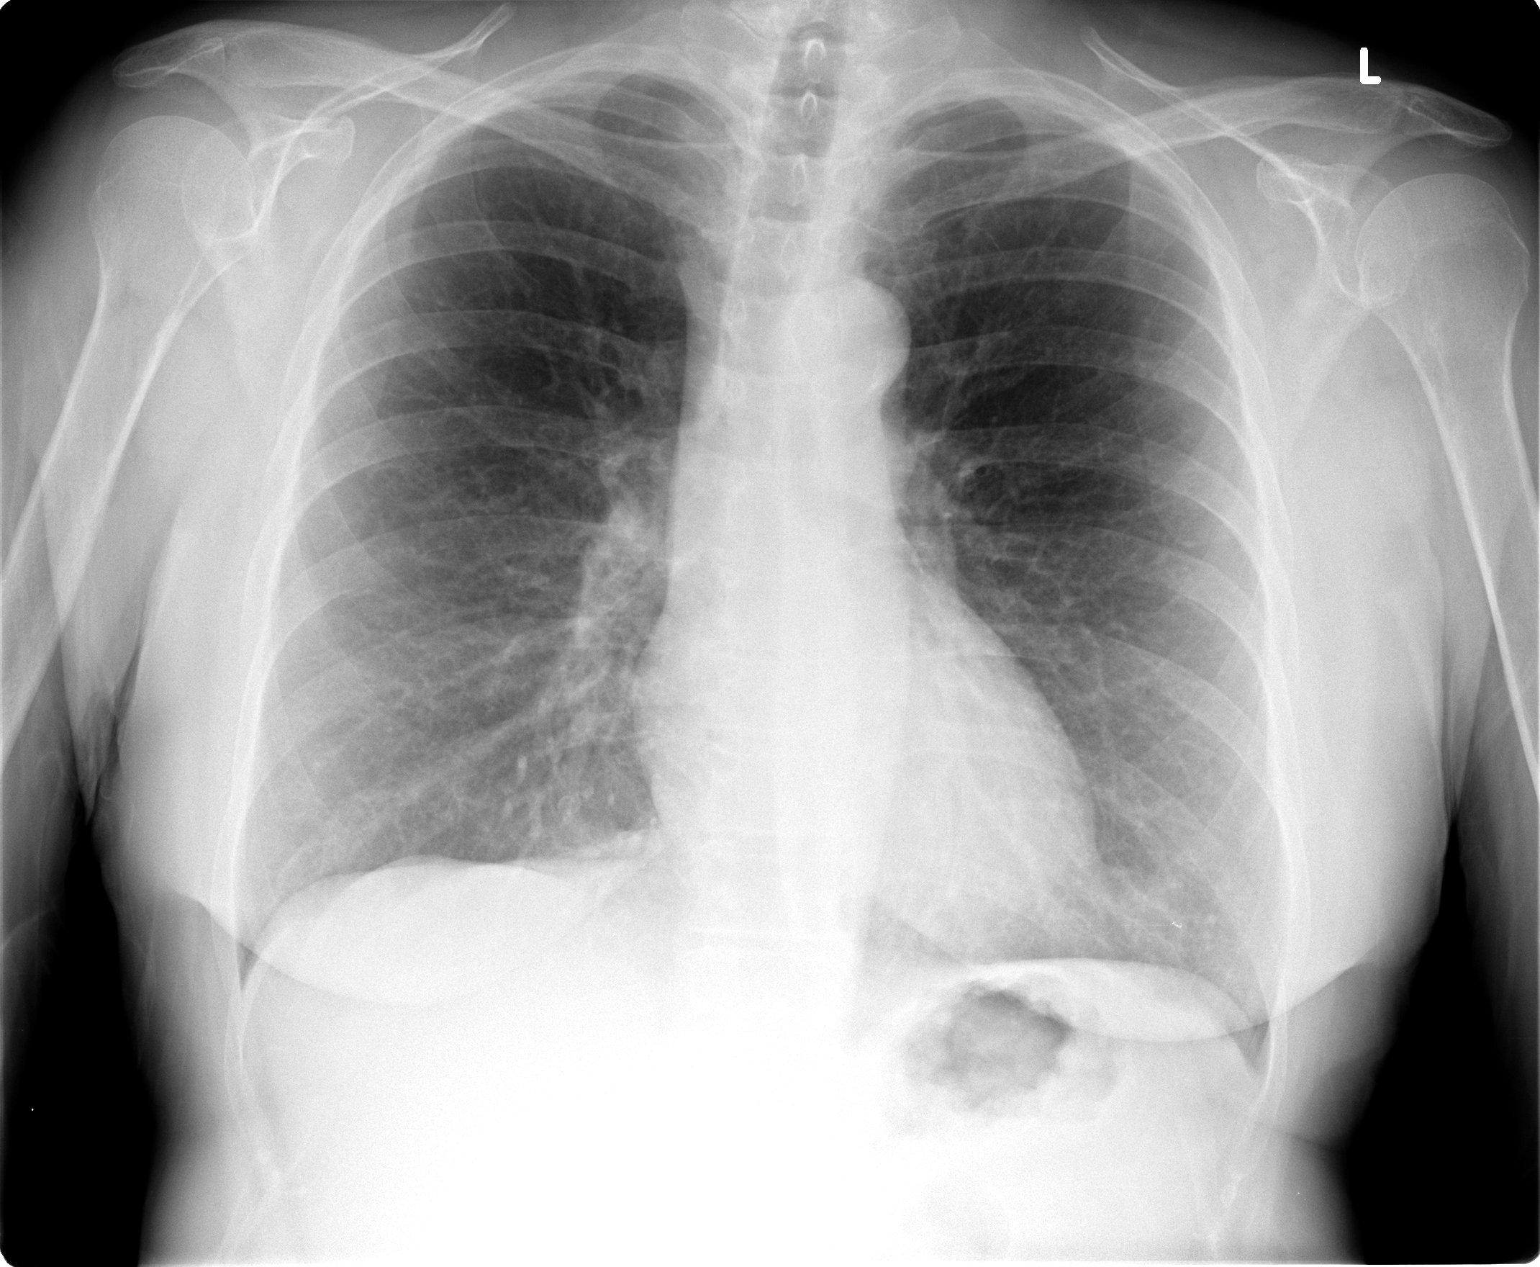

[view not recorded (2 of 2)]
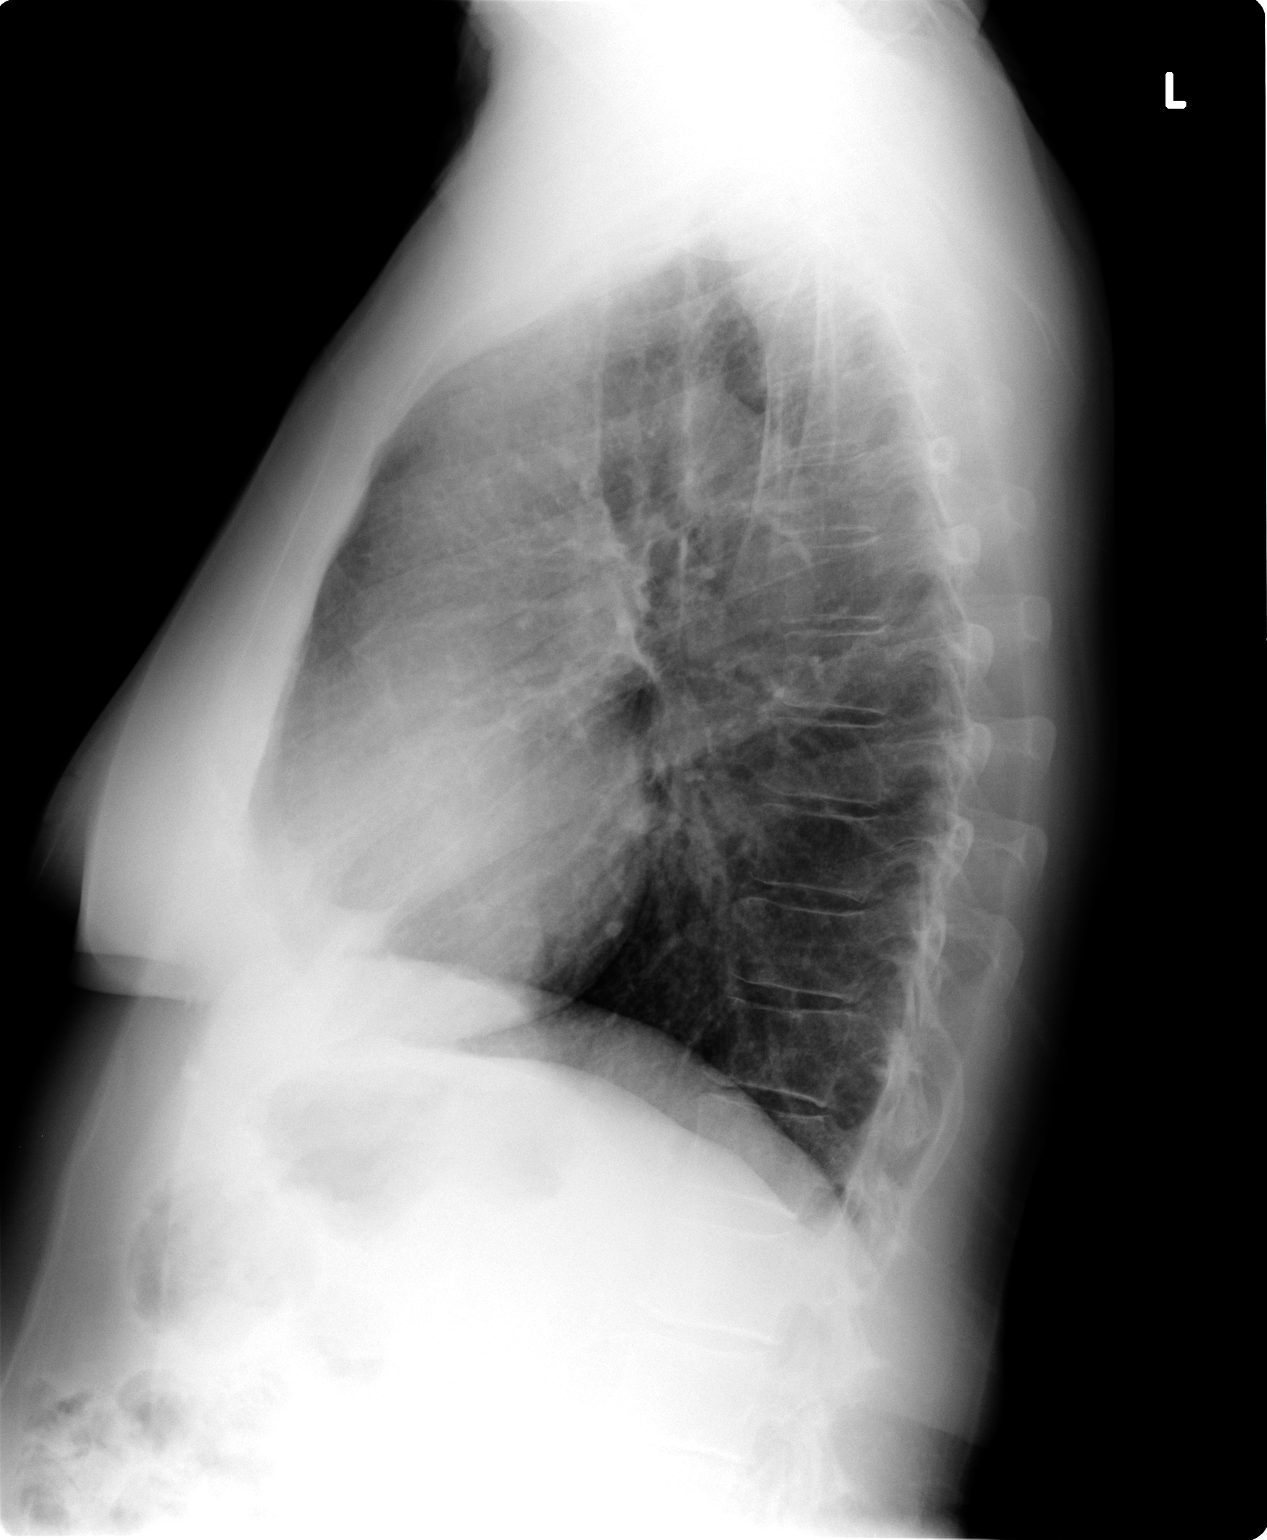

[2 of 2 positions shown; findings below may reference images not displayed]

FINDINGS: The heart size and mediastinal contours are within normal limits.
Both lungs are clear. The visualized skeletal structures are
unremarkable.
IMPRESSION: No active cardiopulmonary disease.

## 2015-12-23 ENCOUNTER — Other Ambulatory Visit: Payer: Self-pay | Admitting: Physician Assistant

## 2016-01-21 ENCOUNTER — Other Ambulatory Visit: Payer: Self-pay | Admitting: *Deleted

## 2016-01-21 MED ORDER — LEVOTHYROXINE SODIUM 50 MCG PO TABS: 50.0000 ug | ORAL_TABLET | Freq: Every day | ORAL | 5 refills | Status: DC

## 2016-01-21 MED ORDER — ATORVASTATIN CALCIUM 10 MG PO TABS: ORAL_TABLET | ORAL | 5 refills | Status: DC

## 2016-04-06 ENCOUNTER — Encounter: Payer: Self-pay | Admitting: Physician Assistant

## 2016-04-06 ENCOUNTER — Ambulatory Visit (INDEPENDENT_AMBULATORY_CARE_PROVIDER_SITE_OTHER): Payer: Federal, State, Local not specified - PPO | Admitting: Physician Assistant

## 2016-04-06 VITALS — BP 117/73 | HR 96 | Ht 64.5 in | Wt 144.0 lb

## 2016-04-06 DIAGNOSIS — F17200 Nicotine dependence, unspecified, uncomplicated: Secondary | ICD-10-CM

## 2016-04-06 DIAGNOSIS — J0100 Acute maxillary sinusitis, unspecified: Secondary | ICD-10-CM

## 2016-04-06 DIAGNOSIS — R05 Cough: Secondary | ICD-10-CM | POA: Diagnosis not present

## 2016-04-06 DIAGNOSIS — R059 Cough, unspecified: Secondary | ICD-10-CM

## 2016-04-06 MED ORDER — ALBUTEROL SULFATE HFA 108 (90 BASE) MCG/ACT IN AERS: 2.0000 | INHALATION_SPRAY | RESPIRATORY_TRACT | 2 refills | Status: DC | PRN

## 2016-04-06 MED ORDER — AMOXICILLIN-POT CLAVULANATE 875-125 MG PO TABS: 1.0000 | ORAL_TABLET | Freq: Two times a day (BID) | ORAL | 0 refills | Status: DC

## 2016-04-06 NOTE — Progress Notes (Signed)
   Subjective:    Patient ID: Gabriela Baker, female    DOB: January 10, 1946, 70 y.o.   MRN: [id number]  HPI Pt is a 70 female who presents to the clinic with 3 weeks of sinus pressure, fatigue and cough. She is a smoker and has a chronic cough. She does not feel like it is much worse than normal. She had a cold a few weeks ago and thought it mostly went away. She now just has a lot of head pressure, sinus pain, and congestion. She has tried mucinex with little relief. She is using albuterol inhaler 1-2 times a month.    Review of Systems  All other systems reviewed and are negative.      Objective:   Physical Exam  Constitutional: She is oriented to person, place, and time. She appears well-developed and well-nourished.  HENT:  Head: Normocephalic and atraumatic.  TM's clear bilaterally.  Oropharynx erythematous without tonsillar swelling or exudate.  Bilateral nasal turbinates red and swollen.  Tenderness over maxillary sinuses to palpation.   Eyes: Conjunctivae are normal. Right eye exhibits no discharge. Left eye exhibits no discharge.  Neck: Normal range of motion. Neck supple.  Cardiovascular: Normal rate, regular rhythm and normal heart sounds.   Pulmonary/Chest: Effort normal and breath sounds normal. She has no wheezes.  Lymphadenopathy:    She has no cervical adenopathy.  Neurological: She is alert and oriented to person, place, and time.  Psychiatric: She has a normal mood and affect. Her behavior is normal.          Assessment & Plan:  Marland KitchenMarland KitchenHailly was seen today for cough, sinus pressure and fatigue.  Diagnoses and all orders for this visit:  Acute maxillary sinusitis, recurrence not specified -     amoxicillin-clavulanate (AUGMENTIN) 875-125 MG tablet; Take 1 tablet by mouth 2 (two) times daily. For 10 days.  Tobacco dependence -     albuterol (PROVENTIL HFA;VENTOLIN HFA) 108 (90 Base) MCG/ACT inhaler; Inhale 2 puffs into the lungs as needed for wheezing or  shortness of breath.  Cough -     albuterol (PROVENTIL HFA;VENTOLIN HFA) 108 (90 Base) MCG/ACT inhaler; Inhale 2 puffs into the lungs as needed for wheezing or shortness of breath.   Reassured pt lung sounded good. I do believe fatigue is coming from ongoing infection. Follow up if symptoms not improving. Follow up if not improving.   Refilled albuterol for as needed use she did not have any more refills.

## 2016-04-06 NOTE — Patient Instructions (Signed)

## 2016-05-05 ENCOUNTER — Ambulatory Visit (INDEPENDENT_AMBULATORY_CARE_PROVIDER_SITE_OTHER): Payer: Federal, State, Local not specified - PPO | Admitting: Physician Assistant

## 2016-05-05 VITALS — BP 123/83 | HR 105 | Temp 97.8°F

## 2016-05-05 DIAGNOSIS — Z23 Encounter for immunization: Secondary | ICD-10-CM

## 2016-05-05 NOTE — Progress Notes (Signed)
Patient came into clinic today for high dose flu vaccination. Patient tolerated injection of flu immunization in left deltoid well, with no immediate complications. Advised to contact our office with any questions/concerns.

## 2016-06-11 ENCOUNTER — Other Ambulatory Visit: Payer: Self-pay | Admitting: Physician Assistant

## 2016-06-25 ENCOUNTER — Ambulatory Visit (INDEPENDENT_AMBULATORY_CARE_PROVIDER_SITE_OTHER): Payer: Federal, State, Local not specified - PPO | Admitting: Physician Assistant

## 2016-06-25 ENCOUNTER — Ambulatory Visit (INDEPENDENT_AMBULATORY_CARE_PROVIDER_SITE_OTHER): Payer: Federal, State, Local not specified - PPO

## 2016-06-25 VITALS — BP 131/76 | HR 97 | Temp 98.2°F | Wt 146.0 lb

## 2016-06-25 DIAGNOSIS — Z87891 Personal history of nicotine dependence: Secondary | ICD-10-CM | POA: Insufficient documentation

## 2016-06-25 DIAGNOSIS — I700 Atherosclerosis of aorta: Secondary | ICD-10-CM | POA: Diagnosis not present

## 2016-06-25 DIAGNOSIS — F17200 Nicotine dependence, unspecified, uncomplicated: Secondary | ICD-10-CM | POA: Insufficient documentation

## 2016-06-25 DIAGNOSIS — J449 Chronic obstructive pulmonary disease, unspecified: Secondary | ICD-10-CM | POA: Diagnosis not present

## 2016-06-25 DIAGNOSIS — J439 Emphysema, unspecified: Secondary | ICD-10-CM | POA: Diagnosis not present

## 2016-06-25 DIAGNOSIS — J069 Acute upper respiratory infection, unspecified: Secondary | ICD-10-CM

## 2016-06-25 MED ORDER — UMECLIDINIUM-VILANTEROL 62.5-25 MCG/INH IN AEPB: 1.0000 | INHALATION_SPRAY | Freq: Every day | RESPIRATORY_TRACT | 0 refills | Status: DC

## 2016-06-25 NOTE — Progress Notes (Signed)
HPI:                                                                Gabriela Baker is a 71 y.o. female who presents to [place]: [facility] today for a sick visit  Patient reports cough and congestion since this morning. Endorses mild sore throat and nasal congestion. Smoking 1 ppd since age 65 (57 packyears). Denies shortness of breath. States that she has a cough at baseline, but this cough hurts her chest. Cough is nonproductive. Denies fever, chills, myalgias. Denies nausea, vomiting, abdominal pain. No recent sick contacts or travel. She has a prescription for an Albuterol inhaler, but she has not been using it. She has not tried anything for the cough.    Health Maintenance Health Maintenance  Topic Date Due  . COLONOSCOPY  04/06/1995  . MAMMOGRAM  01/04/2013  . PNA vac Low Risk Adult (2 of 2 - PPSV23) 04/14/2016  . TETANUS/TDAP  06/13/2023  . INFLUENZA VACCINE  Completed  . DEXA SCAN  Completed  . ZOSTAVAX  Addressed  . Hepatitis C Screening  Completed    Past Medical History:  Diagnosis Date  . Hyperlipidemia   . Thyroid disease    No past surgical history on file. Social History  Substance Use Topics  . Smoking status: Current Every Day Smoker  . Smokeless tobacco: Not on file  . Alcohol use Yes   family history is not on file.  ROS: negative except as noted in the HPI  Medications: Current Outpatient Prescriptions  Medication Sig Dispense Refill  . albuterol (PROVENTIL HFA;VENTOLIN HFA) 108 (90 Base) MCG/ACT inhaler Inhale 2 puffs into the lungs as needed for wheezing or shortness of breath. 1 Inhaler 2  . atorvastatin (LIPITOR) 10 MG tablet 1 TABLET ONCE A DAY ORALLY 30 DAY(S) 30 tablet 5  . Calcium Citrate-Vitamin D (CALCIUM + D PO) Take by mouth daily.    [name] EPINEPHrine 0.3 mg/0.3 mL IJ SOAJ injection Inject 0.3 mLs (0.3 mg total) into the muscle once. (Patient taking differently: Inject 0.3 mg into the muscle once.  ) 1 Device 2  . levothyroxine (SYNTHROID, LEVOTHROID) 50 MCG tablet TAKE 1 TABLET (50 MCG TOTAL) BY MOUTH DAILY BEFORE BREAKFAST. 30 tablet 1  . Multiple Vitamins-Minerals (CENTRUM SILVER PO) Take by mouth daily.    [name] umeclidinium-vilanterol (ANORO ELLIPTA) 62.5-25 MCG/INH AEPB Inhale 1 puff into the lungs daily. 1 each 0   No current facility-administered medications for this visit.    Allergies  Allergen Reactions  . Bee Venom        Objective:  BP 131/76   Pulse 97   Temp 98.2 F (36.8 C) (Oral)   Wt 146 lb (66.2 kg)   BMI 24.67 kg/m  Gen: well-groomed, cooperative, not ill-appearing, no distress HEENT: normal conjunctiva, TM's clear, oropharynx clear, moist mucus membranes, edentulous on top Lungs: Normal work of breathing, breath sounds diminished, expiratory wheeze in the the right upper field, no rales or rhonchi Heart: Normal rate, regular rhythm, s1 and s2 distinct, no murmurs, clicks or rubs Neuro: alert and oriented x 3, EOM's intact Extremities: distal pulses intact, no peripheral edema Skin: warm and dry, no rashes or lesions on exposed skin  CXR 05/09/2014  CLINICAL DATA:  Cough, congestion for a week.  EXAM: CHEST  2 VIEW  COMPARISON:  02/08/2013  FINDINGS: The heart size and mediastinal contours are within normal limits. Both lungs are clear. The visualized skeletal structures are unremarkable.  IMPRESSION: No active cardiopulmonary disease.   Electronically Signed   By: Rolm Baptise M.D.   On: 05/09/2014 12:03  Assessment and Plan: 71 y.o. female with history of smoking greater than 50 pack years and  Acute upper respiratory infection - no signs of COPD exacerbation today - symptomatic management - Albuterol inhaler as needed for wheezing and dyspnea  Chronic obstructive pulmonary disease, unspecified COPD type ([place]) - patient is a candidate for lung cancer screening and CT will yield information if there is an acute cardiopulmonary  process causing a change in her cough - starting patient on daily Anoro - instructed to cut back on cigarettes and consider smoking cessation - CT CHEST LUNG CA SCREEN LOW DOSE W/O CM - umeclidinium-vilanterol (ANORO ELLIPTA) 62.5-25 MCG/INH AEPB; Inhale 1 puff into the lungs daily.  Dispense: 1 each; Refill: 0  Orders Placed This Encounter  Procedures  . CT CHEST LUNG CA SCREEN LOW DOSE W/O CM    Order Specific Question:   Reason for Exam (SYMPTOM  OR DIAGNOSIS REQUIRED)    Answer:   >30 packyear smoking hx, lung ca screening     Patient education and anticipatory guidance given Patient agrees with treatment plan Follow-up with PCP in 2 weeks or sooner as needed  Darlyne Russian PA-C

## 2016-06-25 NOTE — Patient Instructions (Addendum)
Go downstairs for your CT scan Start using new inhaler daily  Reduce the number of cigarettes you are smoking and think about smoking cessation :) Follow-up in 2-3 weeks when you are feeling better for spirometry   Upper Respiratory Infection, Adult Most upper respiratory infections (URIs) are a viral infection of the air passages leading to the lungs. A URI affects the nose, throat, and upper air passages. The most common type of URI is nasopharyngitis and is typically referred to as "the common cold." URIs run their course and usually go away on their own. Most of the time, a URI does not require medical attention, but sometimes a bacterial infection in the upper airways can follow a viral infection. This is called a secondary infection. Sinus and middle ear infections are common types of secondary upper respiratory infections. Bacterial pneumonia can also complicate a URI. A URI can worsen asthma and chronic obstructive pulmonary disease (COPD). Sometimes, these complications can require emergency medical care and may be life threatening. What are the causes? Almost all URIs are caused by viruses. A virus is a type of germ and can spread from one person to another. What increases the risk? You may be at risk for a URI if:  You smoke.  You have chronic heart or lung disease.  You have a weakened defense (immune) system.  You are very young or very old.  You have nasal allergies or asthma.  You work in crowded or poorly ventilated areas.  You work in health care facilities or schools. What are the signs or symptoms? Symptoms typically develop 2-3 days after you come in contact with a cold virus. Most viral URIs last 7-10 days. However, viral URIs from the influenza virus (flu virus) can last 14-18 days and are typically more severe. Symptoms may include:  Runny or stuffy (congested) nose.  Sneezing.  Cough.  Sore throat.  Headache.  Fatigue.  Fever.  Loss of  appetite.  Pain in your forehead, behind your eyes, and over your cheekbones (sinus pain).  Muscle aches. How is this diagnosed? Your health care provider may diagnose a URI by:  Physical exam.  Tests to check that your symptoms are not due to another condition such as:  Strep throat.  Sinusitis.  Pneumonia.  Asthma. How is this treated? A URI goes away on its own with time. It cannot be cured with medicines, but medicines may be prescribed or recommended to relieve symptoms. Medicines may help:  Reduce your fever.  Reduce your cough.  Relieve nasal congestion. Follow these instructions at home:  Take medicines only as directed by your health care provider.  Gargle warm saltwater or take cough drops to comfort your throat as directed by your health care provider.  Use a warm mist humidifier or inhale steam from a shower to increase air moisture. This may make it easier to breathe.  Drink enough fluid to keep your urine clear or pale yellow.  Eat soups and other clear broths and maintain good nutrition.  Rest as needed.  Return to work when your temperature has returned to normal or as your health care provider advises. You may need to stay home longer to avoid infecting others. You can also use a face mask and careful hand washing to prevent spread of the virus.  Increase the usage of your inhaler if you have asthma.  Do not use any tobacco products, including cigarettes, chewing tobacco, or electronic cigarettes. If you need help quitting, ask your health  care provider. How is this prevented? The best way to protect yourself from getting a cold is to practice good hygiene.  Avoid oral or hand contact with people with cold symptoms.  Wash your hands often if contact occurs. There is no clear evidence that vitamin C, vitamin E, echinacea, or exercise reduces the chance of developing a cold. However, it is always recommended to get plenty of rest, exercise, and  practice good nutrition. Contact a health care provider if:  You are getting worse rather than better.  Your symptoms are not controlled by medicine.  You have chills.  You have worsening shortness of breath.  You have brown or red mucus.  You have yellow or brown nasal discharge.  You have pain in your face, especially when you bend forward.  You have a fever.  You have swollen neck glands.  You have pain while swallowing.  You have white areas in the back of your throat. Get help right away if:  You have severe or persistent:  Headache.  Ear pain.  Sinus pain.  Chest pain.  You have chronic lung disease and any of the following:  Wheezing.  Prolonged cough.  Coughing up blood.  A change in your usual mucus.  You have a stiff neck.  You have changes in your:  Vision.  Hearing.  Thinking.  Mood. This information is not intended to replace advice given to you by your health care provider. Make sure you discuss any questions you have with your health care provider. Document Released: 11/11/2000 Document Revised: 01/19/2016 Document Reviewed: 08/23/2013 Elsevier Interactive Patient Education  2017 Else[employer] Inc[employer]   Cough, Adult Coughing is a reflex that clears your throat and your airways. Coughing helps to heal and protect your lungs. It is normal to cough occasionally, but a cough that happens with other symptoms or lasts a long time may be a sign of a condition that needs treatment. A cough may last only 2-3 weeks (acute), or it may last longer than 8 weeks (chronic). What are the causes? Coughing is commonly caused by:  Breathing in substances that irritate your lungs.  A viral or bacterial respiratory infection.  Allergies.  Asthma.  Postnasal drip.  Smoking.  Acid backing up from the stomach into the esophagus (gastroesophageal reflux).  Certain medicines.  Chronic lung problems, including COPD (or rarely, lung cancer).  Other  medical conditions such as heart failure. Follow these instructions at home: Pay attention to any changes in your symptoms. Take these actions to help with your discomfort:  Take medicines only as told by your health care provider.  If you were prescribed an antibiotic medicine, take it as told by your health care provider. Do not stop taking the antibiotic even if you start to feel better.  Talk with your health care provider before you take a cough suppressant medicine.  Drink enough fluid to keep your urine clear or pale yellow.  If the air is dry, use a cold steam vaporizer or humidifier in your bedroom or your home to help loosen secretions.  Avoid anything that causes you to cough at work or at home.  If your cough is worse at night, try sleeping in a semi-upright position.  Avoid cigarette smoke. If you smoke, quit smoking. If you need help quitting, ask your health care provider.  Avoid caffeine.  Avoid alcohol.  Rest as needed. Contact a health care provider if:  You have new symptoms.  You cough up pus.  Your  cough does not get better after 2-3 weeks, or your cough gets worse.  You cannot control your cough with suppressant medicines and you are losing sleep.  You develop pain that is getting worse or pain that is not controlled with pain medicines.  You have a fever.  You have unexplained weight loss.  You have night sweats. Get help right away if:  You cough up blood.  You have difficulty breathing.  Your heartbeat is very fast. This information is not intended to replace advice given to you by your health care provider. Make sure you discuss any questions you have with your health care provider. Document Released: 11/14/2010 Document Revised: 10/24/2015 Document Reviewed: 07/25/2014 Elsevier Interactive Patient Education  2017 [employer].

## 2016-06-27 ENCOUNTER — Encounter: Payer: Self-pay | Admitting: Physician Assistant

## 2016-07-08 ENCOUNTER — Ambulatory Visit (INDEPENDENT_AMBULATORY_CARE_PROVIDER_SITE_OTHER): Payer: Federal, State, Local not specified - PPO | Admitting: Physician Assistant

## 2016-07-08 ENCOUNTER — Encounter: Payer: Self-pay | Admitting: Physician Assistant

## 2016-07-08 ENCOUNTER — Telehealth: Payer: Self-pay | Admitting: Physician Assistant

## 2016-07-08 VITALS — BP 115/71 | HR 112 | Temp 97.8°F | Ht 64.5 in | Wt 144.0 lb

## 2016-07-08 DIAGNOSIS — J42 Unspecified chronic bronchitis: Secondary | ICD-10-CM | POA: Diagnosis not present

## 2016-07-08 DIAGNOSIS — B9689 Other specified bacterial agents as the cause of diseases classified elsewhere: Secondary | ICD-10-CM

## 2016-07-08 DIAGNOSIS — J019 Acute sinusitis, unspecified: Secondary | ICD-10-CM

## 2016-07-08 DIAGNOSIS — I700 Atherosclerosis of aorta: Secondary | ICD-10-CM

## 2016-07-08 MED ORDER — IPRATROPIUM BROMIDE 0.06 % NA SOLN: 2.0000 | Freq: Four times a day (QID) | NASAL | 12 refills | Status: DC

## 2016-07-08 MED ORDER — AMOXICILLIN-POT CLAVULANATE 875-125 MG PO TABS: 1.0000 | ORAL_TABLET | Freq: Two times a day (BID) | ORAL | 0 refills | Status: DC

## 2016-07-08 NOTE — Progress Notes (Signed)
   Subjective:    Patient ID: Gabriela Baker, female    DOB: 04-01-1946, 71 y.o.   MRN: [id number]  HPI  Pt is a 71 yo female who presents to the clinic with sinus pressure for last 4 weeks. She was seen 2 weeks ago and dx with URI and symptomatic care. Today symptoms have progressed and worsened. She is having a lot of nasal bridge pain and congestion. No fever, chills, body aches. She denies any cough, wheezing, SOB. She started taking samples of anoro because too expensive. She states she has not noticed much of a benefit but maybe walking long distances is easier. Cost was over 100 dollars and not affordable. She has not cut back on smoking.    Review of Systems  All other systems reviewed and are negative.      Objective:   Physical Exam  Constitutional: She is oriented to person, place, and time. She appears well-developed and well-nourished.  HENT:  Head: Normocephalic and atraumatic.  Right Ear: External ear normal.  Left Ear: External ear normal.  TM's erythematous.  Oropharynx erythematous.  Bilateral red and swollen nasal turbinates.  Tenderness over nasal bridge and frontal sinuses to palpation.   Eyes: Conjunctivae are normal. Right eye exhibits no discharge. Left eye exhibits no discharge.  Neck: Normal range of motion. Neck supple.  Cardiovascular: Normal rate, regular rhythm and normal heart sounds.   Pulmonary/Chest: Effort normal and breath sounds normal. She has no wheezes.  Lymphadenopathy:    She has no cervical adenopathy.  Neurological: She is alert and oriented to person, place, and time.  Psychiatric: She has a normal mood and affect. Her behavior is normal.          Assessment & Plan:  Marland KitchenMarland KitchenPrisila was seen today for sinus congestion.  Diagnoses and all orders for this visit:  Acute bacterial rhinosinusitis -     ipratropium (ATROVENT) 0.06 % nasal spray; Place 2 sprays into both nostrils 4 (four) times daily. -     amoxicillin-clavulanate  (AUGMENTIN) 875-125 MG tablet; Take 1 tablet by mouth 2 (two) times daily.  Aortic atherosclerosis (HCC)  Chronic bronchitis, unspecified chronic bronchitis type ([place])   Pt on statin after CT showed atherosclerosis. Last LDL under 100 in 2017.   Continue on anoro. Will get amber to call about patient assistance program for patient to afford.

## 2016-07-08 NOTE — Telephone Encounter (Signed)
Gabriela Baker,  Could you call pt assistance for the drug anoro and see if she qualifies for assistance? It is over 100 dollars a month through pharmacy.

## 2016-08-04 ENCOUNTER — Other Ambulatory Visit: Payer: Self-pay | Admitting: Physician Assistant

## 2016-09-15 ENCOUNTER — Other Ambulatory Visit: Payer: Self-pay | Admitting: *Deleted

## 2016-09-15 DIAGNOSIS — J449 Chronic obstructive pulmonary disease, unspecified: Secondary | ICD-10-CM

## 2016-09-15 DIAGNOSIS — Z87891 Personal history of nicotine dependence: Secondary | ICD-10-CM

## 2016-09-15 MED ORDER — UMECLIDINIUM-VILANTEROL 62.5-25 MCG/INH IN AEPB: 1.0000 | INHALATION_SPRAY | Freq: Every day | RESPIRATORY_TRACT | 1 refills | Status: DC

## 2016-10-02 ENCOUNTER — Other Ambulatory Visit: Payer: Self-pay | Admitting: Physician Assistant

## 2016-11-28 ENCOUNTER — Other Ambulatory Visit: Payer: Self-pay | Admitting: Physician Assistant

## 2017-02-02 ENCOUNTER — Other Ambulatory Visit: Payer: Self-pay | Admitting: Physician Assistant

## 2017-03-01 ENCOUNTER — Other Ambulatory Visit: Payer: Self-pay | Admitting: Physician Assistant

## 2017-03-05 ENCOUNTER — Encounter: Payer: Self-pay | Admitting: Physician Assistant

## 2017-03-05 ENCOUNTER — Ambulatory Visit (INDEPENDENT_AMBULATORY_CARE_PROVIDER_SITE_OTHER): Payer: Federal, State, Local not specified - PPO | Admitting: Physician Assistant

## 2017-03-05 VITALS — BP 127/70 | HR 94 | Temp 98.3°F | Ht 64.0 in | Wt 142.7 lb

## 2017-03-05 DIAGNOSIS — Z13820 Encounter for screening for osteoporosis: Secondary | ICD-10-CM

## 2017-03-05 DIAGNOSIS — J411 Mucopurulent chronic bronchitis: Secondary | ICD-10-CM

## 2017-03-05 DIAGNOSIS — Z87891 Personal history of nicotine dependence: Secondary | ICD-10-CM

## 2017-03-05 DIAGNOSIS — Z131 Encounter for screening for diabetes mellitus: Secondary | ICD-10-CM

## 2017-03-05 DIAGNOSIS — Z23 Encounter for immunization: Secondary | ICD-10-CM

## 2017-03-05 DIAGNOSIS — Z87892 Personal history of anaphylaxis: Secondary | ICD-10-CM

## 2017-03-05 DIAGNOSIS — M8580 Other specified disorders of bone density and structure, unspecified site: Secondary | ICD-10-CM

## 2017-03-05 DIAGNOSIS — E039 Hypothyroidism, unspecified: Secondary | ICD-10-CM

## 2017-03-05 DIAGNOSIS — I700 Atherosclerosis of aorta: Secondary | ICD-10-CM

## 2017-03-05 DIAGNOSIS — Z780 Asymptomatic menopausal state: Secondary | ICD-10-CM

## 2017-03-05 MED ORDER — PNEUMOCOCCAL VAC POLYVALENT 25 MCG/0.5ML IJ INJ
0.5000 mL | INJECTION | Freq: Once | INTRAMUSCULAR | Status: AC
  Administered 2017-03-05: 0.5 mL via INTRAMUSCULAR

## 2017-03-05 MED ORDER — EPINEPHRINE 0.3 MG/0.3ML IJ SOAJ: 0.3000 mg | Freq: Once | INTRAMUSCULAR | 1 refills | Status: DC

## 2017-03-05 NOTE — Progress Notes (Signed)
   Subjective:    Patient ID: Gabriela Baker, female    DOB: 05-13-1946, 71 y.o.   MRN: [id number]  HPI  Pt is a 71 yo female who presents to the clinic for medication follow up.   COPD- she is not taking anoro everyday due to cost but tries to take a few times a week and does seem to help. Does not use rescue daily. She continues to smoke and not interested in quiting. She has smoked for 50 plus years.   Hypothyroidism- no problems or concerns. Needs refill.   Hyperlipidemia-  No problems or concerns. Needs refill.   .. Active Ambulatory Problems    Diagnosis Date Noted  . History of anaphylaxis 04/15/2015  . Thyroid activity decreased 04/15/2015  . Tobacco dependence 04/15/2015  . Hyperlipidemia 04/15/2015  . Osteopenia 04/15/2015  . Post-menopausal 04/15/2015  . 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 Hypertriglyceridemia 10/08/2015  . Tobacco use disorder 06/25/2016  . Chronic obstructive pulmonary disease ([place]) 06/25/2016  . History of smoking greater than 50 pack years 06/25/2016  . Aortic atherosclerosis ([place]) 06/25/2016   Resolved Ambulatory Problems    Diagnosis Date Noted  . No Resolved Ambulatory Problems   Past Medical History:  Diagnosis Date  . Hyperlipidemia   . Thyroid disease       Review of Systems  All other systems reviewed and are negative.      Objective:   Physical Exam  Constitutional: She is oriented to person, place, and time. She appears well-developed and well-nourished.  HENT:  Head: Normocephalic and atraumatic.  Neck: Normal range of motion. Neck supple. No thyromegaly present.  Cardiovascular: Normal rate, regular rhythm and normal heart sounds.   Pulmonary/Chest: Effort normal and breath sounds normal.  Neurological: She is alert and oriented to person, place, and time.  Psychiatric: She has a normal mood and affect. Her behavior is normal.          Assessment & Plan:  Marland KitchenMarland KitchenYocelin was seen today for immunizations.  Diagnoses and all  orders for this visit:  Mucopurulent chronic bronchitis ([place])  Need for influenza vaccination -     Flu vaccine HIGH DOSE PF (Fluzone High dose)  Need for pneumococcal vaccination -     pneumococcal 23 valent vaccine (PNU-IMMUNE) injection 0.5 mL; Inject 0.5 mLs into the muscle once.  Aortic atherosclerosis (HCC) -     Lipid panel  Hypothyroidism, unspecified type -     TSH  History of smoking greater than 50 pack years -     Ambulatory Referral for Lung Cancer Scre -     DG Bone Density  Screening for diabetes mellitus -     COMPLETE METABOLIC PANEL WITH GFR  Osteopenia, unspecified location -     DG Bone Density  Post-menopausal -     DG Bone Density  Osteoporosis screening -     DG Bone Density  History of anaphylaxis -     EPINEPHrine (ADRENACLICK) 0.3 DH/7.4 mL IJ SOAJ injection; Inject 0.3 mLs (0.3 mg total) into the muscle once.   Encouraged patient to take anoro daily.  Pt would qualify for low dose CT. Made referral.  Pt not interested in [place].   Pt declined colonoscopy. cologuard info faxed.  Pt declined mammography.  Bone density ordered.

## 2017-03-06 LAB — COMPLETE METABOLIC PANEL WITH GFR
AG RATIO: 1.6 (calc) (ref 1.0–2.5)
ALT: 24 U/L (ref 6–29)
AST: 25 U/L (ref 10–35)
Albumin: 4.4 g/dL (ref 3.6–5.1)
Alkaline phosphatase (APISO): 89 U/L (ref 33–130)
BILIRUBIN TOTAL: 0.5 mg/dL (ref 0.2–1.2)
BUN: 9 mg/dL (ref 7–25)
CALCIUM: 9.8 mg/dL (ref 8.6–10.4)
CO2: 28 mmol/L (ref 20–32)
Chloride: 101 mmol/L (ref 98–110)
Creat: 0.74 mg/dL (ref 0.60–0.93)
GFR, EST AFRICAN AMERICAN: 94 mL/min/{1.73_m2} (ref >=60)
GFR, EST NON AFRICAN AMERICAN: 82 mL/min/{1.73_m2} (ref >=60)
GLOBULIN: 2.7 g/dL (ref 1.9–3.7)
Glucose, Bld: 92 mg/dL (ref 65–99)
POTASSIUM: 4.7 mmol/L (ref 3.5–5.3)
SODIUM: 139 mmol/L (ref 135–146)
TOTAL PROTEIN: 7.1 g/dL (ref 6.1–8.1)

## 2017-03-06 LAB — LIPID PANEL
CHOLESTEROL: 250 mg/dL — AB (ref <200)
HDL: 75 mg/dL (ref >50)
LDL Cholesterol (Calc): 138 mg/dL (calc) — ABNORMAL HIGH
Non-HDL Cholesterol (Calc): 175 mg/dL (calc) — ABNORMAL HIGH (ref <130)
TRIGLYCERIDES: 229 mg/dL — AB (ref <150)
Total CHOL/HDL Ratio: 3.3 (calc) (ref <5.0)

## 2017-03-06 LAB — TSH: TSH: 1.76 m[IU]/L (ref 0.40–4.50)

## 2017-03-11 ENCOUNTER — Other Ambulatory Visit: Payer: Self-pay

## 2017-03-11 MED ORDER — ATORVASTATIN CALCIUM 20 MG PO TABS: 20.0000 mg | ORAL_TABLET | Freq: Every day | ORAL | 0 refills | Status: DC

## 2017-03-21 LAB — COLOGUARD: COLOGUARD: NEGATIVE

## 2017-03-24 ENCOUNTER — Ambulatory Visit (INDEPENDENT_AMBULATORY_CARE_PROVIDER_SITE_OTHER): Payer: Federal, State, Local not specified - PPO

## 2017-03-24 DIAGNOSIS — M8589 Other specified disorders of bone density and structure, multiple sites: Secondary | ICD-10-CM

## 2017-03-24 NOTE — Progress Notes (Signed)
Call pt: osteopenia. Continue with vitamin D and calcium to keep bone density up.

## 2017-03-28 ENCOUNTER — Other Ambulatory Visit: Payer: Self-pay | Admitting: Physician Assistant

## 2017-04-10 ENCOUNTER — Other Ambulatory Visit: Payer: Self-pay | Admitting: Physician Assistant

## 2017-04-20 ENCOUNTER — Telehealth: Payer: Self-pay | Admitting: Acute Care

## 2017-04-21 NOTE — Telephone Encounter (Signed)
Called patient and left message to make her aware that Langley Gauss is back in the office next week and will contact her to schedule appt with SG.

## 2017-04-26 NOTE — Telephone Encounter (Signed)
Spoke with pt and discussed SDMV and CT.  Pt has [employer] and was informed that they do not cover the LCS.  Pt was given the out of pocket charges for this service.  Pt states she cannot afford at this time and does not wish to participate.  Letter sent to [employer] to make her aware.

## 2017-05-08 ENCOUNTER — Other Ambulatory Visit: Payer: Self-pay | Admitting: Physician Assistant

## 2017-06-12 ENCOUNTER — Other Ambulatory Visit: Payer: Self-pay | Admitting: Physician Assistant

## 2017-07-13 ENCOUNTER — Other Ambulatory Visit: Payer: Self-pay | Admitting: Physician Assistant

## 2017-10-07 ENCOUNTER — Other Ambulatory Visit: Payer: Self-pay | Admitting: Physician Assistant

## 2017-10-21 ENCOUNTER — Other Ambulatory Visit: Payer: Self-pay | Admitting: Physician Assistant

## 2017-10-24 ENCOUNTER — Emergency Department (INDEPENDENT_AMBULATORY_CARE_PROVIDER_SITE_OTHER)
Admission: EM | Admit: 2017-10-24 | Discharge: 2017-10-24 | Disposition: A | Discharge status: Home / Self Care | Payer: Federal, State, Local not specified - PPO | Source: Home / Self Care

## 2017-10-24 ENCOUNTER — Encounter: Payer: Self-pay | Admitting: Emergency Medicine

## 2017-10-24 ENCOUNTER — Other Ambulatory Visit: Payer: Self-pay

## 2017-10-24 DIAGNOSIS — L089 Local infection of the skin and subcutaneous tissue, unspecified: Secondary | ICD-10-CM | POA: Diagnosis not present

## 2017-10-24 DIAGNOSIS — W57XXXA Bitten or stung by nonvenomous insect and other nonvenomous arthropods, initial encounter: Secondary | ICD-10-CM

## 2017-10-24 MED ORDER — CEPHALEXIN 500 MG PO CAPS: 500.0000 mg | ORAL_CAPSULE | Freq: Two times a day (BID) | ORAL | 0 refills | Status: AC

## 2017-10-24 MED ORDER — TRIAMCINOLONE ACETONIDE 0.1 % EX CREA: 1.0000 "application " | TOPICAL_CREAM | Freq: Three times a day (TID) | CUTANEOUS | 0 refills | Status: DC

## 2017-10-24 NOTE — ED Triage Notes (Signed)
Reports noticing a scattering of reddened spots 7 days ago that have not improved; known allergy to bees but she has not felt any sting.

## 2017-10-24 NOTE — ED Provider Notes (Signed)
[name]    CSN: [id number] Arrival date & time: 10/24/17  1406     History   Chief Complaint Chief Complaint  Patient presents with  . Insect Bite   HPI Gabriela Baker is a 72 y.o. female.  Presents for evaluation of multiple insect bites. Type of insect unknown, although she notes an active spider infestation within her apartment building. She initially noticed "bumps" which were red and itchy over 1 week ago on her lower legs and arms. However, over 1 week she has developed subsequent insect bites of which a few of the affected areas are now warm to touch, swollen, and persistently itching. She has attempted relief with OTC anti-itch spray and rubbing alcohol which improves symptoms temporarily. Denies known fever, chills, nausea, vomiting, fatigue, or body aches. Past Medical History:  Diagnosis Date  . Hyperlipidemia   . Thyroid disease     Patient Active Problem List   Diagnosis Date Noted  . Tobacco use disorder 06/25/2016  . Chronic obstructive pulmonary disease ([place]) 06/25/2016  . History of smoking greater than 50 pack years 06/25/2016  . Aortic atherosclerosis ([place]) 06/25/2016  . Hypertriglyceridemia 10/08/2015  . 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 History of anaphylaxis 04/15/2015  . Thyroid activity decreased 04/15/2015  . Tobacco dependence 04/15/2015  . Hyperlipidemia 04/15/2015  . Osteopenia 04/15/2015  . Post-menopausal 04/15/2015    History reviewed. No pertinent surgical history.  OB History   None      Home Medications    Prior to Admission medications   Medication Sig Start Date End Date Taking? Authorizing Provider  albuterol (PROVENTIL HFA;VENTOLIN HFA) 108 (90 Base) MCG/ACT inhaler Inhale 2 puffs into the lungs as needed for wheezing or shortness of breath. 04/06/16   Breeback, Jade L, PA-C  atorvastatin (LIPITOR) 20 MG tablet TAKE 1 TABLET BY MOUTH EVERY DAY 10/21/17   Breeback, Jade L, PA-C  Calcium Citrate-Vitamin D (CALCIUM +  D PO) Take by mouth daily.    [provider]  levothyroxine (SYNTHROID, LEVOTHROID) 50 MCG tablet TAKE 1 TABLET (50 MCG TOTAL) BY MOUTH DAILY BEFORE BREAKFAST. 10/11/17   Breeback, Royetta Car, PA-C  Multiple Vitamins-Minerals (CENTRUM SILVER PO) Take by mouth daily.    [provider]  umeclidinium-vilanterol (ANORO ELLIPTA) 62.5-25 MCG/INH AEPB Inhale 1 puff into the lungs daily. 09/15/16   Donella Stade, PA-C    Family History No family history on file.  Social History Social History   Tobacco Use  . Smoking status: Current Every Day Smoker    Packs/day: 1.00    Years: 54.00    Pack years: 54.00    Types: Cigarettes  . Smokeless tobacco: Never Used  Substance Use Topics  . Alcohol use: Yes  . Drug use: No     Allergies   Bee venom   Review of Systems Review of Systems  Constitutional: Negative for activity change, appetite change, chills, fatigue, fever and unexpected weight change.  Respiratory: Negative.   Cardiovascular: Negative.   Musculoskeletal: Negative.   Skin:       Multiple insect bite lesion Skin erythema. Skin warmness.  Psychiatric/Behavioral: Negative.    Triage Vital Signs ED Triage Vitals [10/24/17 1422]  Enc Vitals Group     BP 125/80     Pulse Rate 100     Resp 18     Temp 99.1 F (37.3 C)     Temp Source Oral     SpO2 97 %     Weight  140 lb (63.5 kg)     Height 5\' 4"  (1.626 m)     Head Circumference      Peak Flow      Pain Score 0     Pain Loc      Pain Edu?      Excl. in [place]?    No data found.  Updated Vital Signs BP 125/80 (BP Location: Right Arm)   Pulse 100   Temp 99.1 F (37.3 C) (Oral)   Resp 18   Ht 5\' 4"  (1.626 m)   Wt 140 lb (63.5 kg)   SpO2 97%   BMI 24.03 kg/m   Visual Acuity Right Eye Distance:   Left Eye Distance:   Bilateral Distance:    Right Eye Near:   Left Eye Near:    Bilateral Near:     Physical Exam  Constitutional: She is oriented to person, place, and time. She appears  well-developed and well-nourished.  HENT:  Head: Normocephalic and atraumatic.  Eyes: Pupils are equal, round, and reactive to light. EOM are normal.  Neck: Normal range of motion.  Cardiovascular: Normal rate, regular rhythm, normal heart sounds and intact distal pulses.  Pulmonary/Chest: Effort normal and breath sounds normal.  Musculoskeletal: Normal range of motion.  Neurological: She is alert and oriented to person, place, and time.  Skin: Skin is warm. Lesion and rash noted. Rash is not pustular. There is erythema.  Multiple insect bite lesion present : behind right ear, Right posterior forearm, left wrist, left and right lower extremity.  Right posterior forearm lesion is edematous, erythematous, and warm. Erythema is distributed beyond the initial insect bite location.  Right wrist lesion edematous, erythematous,and warm.   Psychiatric: She has a normal mood and affect. Her behavior is normal. Judgment and thought content normal.   UC Treatments / Results  Labs (all labs ordered are listed, but only abnormal results are displayed) Labs Reviewed - No data to display  EKG None  Radiology No results found.  Procedures Procedures (including critical care time)  Medications Ordered in UC Medications - No data to display  Initial Impression / Assessment and Plan / UC Course  I have reviewed the triage vital signs and the nursing notes.  Pertinent labs & imaging results that were available during my care of the patient were reviewed by me and considered in my medical decision making (see chart for details).  Patient presents today with multiple lesions which appear consistent with insect bites. Two lesions are concerning for early possible cellulitis infection: (Right posterior forearm and Left wrist). Treating what is most likely a secondary skin infection, from initial insect bites empirically with a broad spectrum antibiotic. Also prescribing a low potency steriodal cream to  manage itching. Patient given strict return precautions to follow-up with PCP or here at urgent care if symptoms worsen or do not improve within 48-72 hours. Patient verbalized understanding.      Final Clinical Impressions(s) / UC Diagnoses   Final diagnoses:  Insect bite, unspecified site, initial encounter  Skin infection     Discharge Instructions     Take medications as prescribed. Follow-up if symptoms worsen or do not improve.     ED Prescriptions    Medication Sig Dispense Auth. Provider   triamcinolone cream (KENALOG) 0.1 % Apply 1 application topically 3 (three) times daily. 30 g Scot Jun, FNP   cephALEXin (KEFLEX) 500 MG capsule Take 1 capsule (500 mg total) by mouth 2 (two) times daily  for 10 days. 20 capsule Scot Jun, FNP     Controlled Substance Prescriptions [place] Controlled Substance Registry consulted? Not Applicable   Scot Jun, FNP 10/24/17 1640

## 2017-10-24 NOTE — Discharge Instructions (Signed)
Take medications as prescribed. Follow-up if symptoms worsen or do not improve.

## 2018-01-29 ENCOUNTER — Other Ambulatory Visit: Payer: Self-pay | Admitting: Physician Assistant

## 2018-02-14 ENCOUNTER — Other Ambulatory Visit: Payer: Self-pay | Admitting: Physician Assistant

## 2018-02-28 ENCOUNTER — Encounter: Payer: Self-pay | Admitting: Physician Assistant

## 2018-02-28 ENCOUNTER — Ambulatory Visit: Payer: Federal, State, Local not specified - PPO | Admitting: Physician Assistant

## 2018-02-28 VITALS — BP 113/83 | HR 99 | Wt 142.0 lb

## 2018-02-28 DIAGNOSIS — E782 Mixed hyperlipidemia: Secondary | ICD-10-CM

## 2018-02-28 DIAGNOSIS — Z23 Encounter for immunization: Secondary | ICD-10-CM | POA: Diagnosis not present

## 2018-02-28 DIAGNOSIS — Z13220 Encounter for screening for lipoid disorders: Secondary | ICD-10-CM

## 2018-02-28 DIAGNOSIS — Z131 Encounter for screening for diabetes mellitus: Secondary | ICD-10-CM

## 2018-02-28 DIAGNOSIS — E039 Hypothyroidism, unspecified: Secondary | ICD-10-CM | POA: Diagnosis not present

## 2018-02-28 DIAGNOSIS — E781 Pure hyperglyceridemia: Secondary | ICD-10-CM | POA: Diagnosis not present

## 2018-02-28 DIAGNOSIS — Z87891 Personal history of nicotine dependence: Secondary | ICD-10-CM

## 2018-02-28 DIAGNOSIS — J411 Mucopurulent chronic bronchitis: Secondary | ICD-10-CM

## 2018-02-28 DIAGNOSIS — J449 Chronic obstructive pulmonary disease, unspecified: Secondary | ICD-10-CM

## 2018-02-28 MED ORDER — LEVOTHYROXINE SODIUM 50 MCG PO TABS: 50.0000 ug | ORAL_TABLET | Freq: Every day | ORAL | 3 refills | Status: DC

## 2018-02-28 MED ORDER — ATORVASTATIN CALCIUM 20 MG PO TABS: 20.0000 mg | ORAL_TABLET | Freq: Every day | ORAL | 3 refills | Status: DC

## 2018-02-28 MED ORDER — AMBULATORY NON FORMULARY MEDICATION: 0 refills | Status: DC

## 2018-02-28 MED ORDER — UMECLIDINIUM-VILANTEROL 62.5-25 MCG/INH IN AEPB: 1.0000 | INHALATION_SPRAY | Freq: Every day | RESPIRATORY_TRACT | 1 refills | Status: DC

## 2018-02-28 NOTE — Patient Instructions (Signed)
Medicare wellness

## 2018-02-28 NOTE — Progress Notes (Signed)
Subjective:    Patient ID: Gabriela Baker, female    DOB: Jan 13, 1946, 72 y.o.   MRN: [id number]  HPI Pt is a 72 yo female with COPD, aortic atherosclerosis, hypothyroidism, HLD who presents to the clinic for follow up and medication refills.   Patient is doing great and taking all medications as indicated.  She has no concerns or complaints.  She has plenty of energy.  She continues to smoke daily.  She has no interest in quitting.  .. Active Ambulatory Problems    Diagnosis Date Noted  . History of anaphylaxis 04/15/2015  . Thyroid activity decreased 04/15/2015  . Tobacco dependence 04/15/2015  . Hyperlipidemia 04/15/2015  . Osteopenia 04/15/2015  . Post-menopausal 04/15/2015  . 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 Hypertriglyceridemia 10/08/2015  . Tobacco use disorder 06/25/2016  . Chronic obstructive pulmonary disease ([place]) 06/25/2016  . History of smoking greater than 50 pack years 06/25/2016  . Aortic atherosclerosis ([place]) 06/25/2016   Resolved Ambulatory Problems    Diagnosis Date Noted  . No Resolved Ambulatory Problems   Past Medical History:  Diagnosis Date  . Thyroid disease       Review of Systems  All other systems reviewed and are negative.      Objective:   Physical Exam  Constitutional: She is oriented to person, place, and time. She appears well-developed and well-nourished.  HENT:  Head: Normocephalic and atraumatic.  Right Ear: External ear normal.  Left Ear: External ear normal.  Nose: Nose normal.  Mouth/Throat: Oropharynx is clear and moist. No oropharyngeal exudate.  Eyes: Conjunctivae are normal.  Neck: Normal range of motion. Neck supple. No thyromegaly present.  Cardiovascular: Normal rate and regular rhythm.  Pulmonary/Chest: Effort normal and breath sounds normal.  Neurological: She is alert and oriented to person, place, and time.  Skin: No rash noted.  Psychiatric: She has a normal mood and affect. Her behavior is normal.           Assessment & Plan:  Marland KitchenMarland KitchenDiagnoses and all orders for this visit:  Mixed hyperlipidemia -     Lipid Panel w/reflex Direct LDL -     atorvastatin (LIPITOR) 20 MG tablet; Take 1 tablet (20 mg total) by mouth daily.  Hypertriglyceridemia -     Lipid Panel w/reflex Direct LDL -     atorvastatin (LIPITOR) 20 MG tablet; Take 1 tablet (20 mg total) by mouth daily.  Mucopurulent chronic bronchitis (HCC) -     COMPLETE METABOLIC PANEL WITH GFR  Hypothyroidism, unspecified type -     TSH -     levothyroxine (SYNTHROID, LEVOTHROID) 50 MCG tablet; Take 1 tablet (50 mcg total) by mouth daily before breakfast.  Screening for lipid disorders  Screening for diabetes mellitus -     COMPLETE METABOLIC PANEL WITH GFR  Need for immunization against influenza -     Flu Vaccine QUAD 36+ mos IM  Chronic obstructive pulmonary disease, unspecified COPD type ([place]) -     umeclidinium-vilanterol (ANORO ELLIPTA) 62.5-25 MCG/INH AEPB; Inhale 1 puff into the lungs daily.  History of smoking greater than 50 pack years -     umeclidinium-vilanterol (ANORO ELLIPTA) 62.5-25 MCG/INH AEPB; Inhale 1 puff into the lungs daily.  Need for shingles vaccine -     AMBULATORY NON FORMULARY MEDICATION; shingrx 2 doses to prevent shingles.   .. Depression screen [facility] 2/9 02/28/2018 03/05/2017  Decreased Interest 0 0  Down, Depressed, Hopeless 0 0  PHQ - 2 Score 0 0  Altered sleeping 0 -  Tired, decreased energy 0 -  Change in appetite 0 -  Feeling bad or failure about yourself  0 -  Trouble concentrating 0 -  Moving slowly or fidgety/restless 0 -  Suicidal thoughts 0 -  PHQ-9 Score 0 -  Difficult doing work/chores Not difficult at all -   Ordered fasting labs today.  She did go ahead and get her flu shot.  She is up-to-date on her pneumonia vaccines.  She has not had her shingles vaccine.  I gave her a printed prescription to get Shingrix 2 dose series.  Medications refilled and will be changed  pending labs.  Encourage patient to quit smoking however she has no interest in this today.  Follow-up in 6 months.

## 2018-03-01 LAB — COMPLETE METABOLIC PANEL WITH GFR
AG RATIO: 1.8 (calc) (ref 1.0–2.5)
ALKALINE PHOSPHATASE (APISO): 85 U/L (ref 33–130)
ALT: 26 U/L (ref 6–29)
AST: 27 U/L (ref 10–35)
Albumin: 4.5 g/dL (ref 3.6–5.1)
BUN: 7 mg/dL (ref 7–25)
CALCIUM: 9.7 mg/dL (ref 8.6–10.4)
CHLORIDE: 102 mmol/L (ref 98–110)
CO2: 30 mmol/L (ref 20–32)
Creat: 0.7 mg/dL (ref 0.60–0.93)
GFR, Est African American: 100 mL/min/{1.73_m2} (ref >=60)
GFR, Est Non African American: 87 mL/min/{1.73_m2} (ref >=60)
GLOBULIN: 2.5 g/dL (ref 1.9–3.7)
Glucose, Bld: 92 mg/dL (ref 65–99)
POTASSIUM: 4.9 mmol/L (ref 3.5–5.3)
SODIUM: 140 mmol/L (ref 135–146)
Total Bilirubin: 0.5 mg/dL (ref 0.2–1.2)
Total Protein: 7 g/dL (ref 6.1–8.1)

## 2018-03-01 LAB — LIPID PANEL W/REFLEX DIRECT LDL
CHOL/HDL RATIO: 3.4 (calc) (ref <5.0)
Cholesterol: 244 mg/dL — ABNORMAL HIGH (ref <200)
HDL: 71 mg/dL (ref >50)
LDL CHOLESTEROL (CALC): 141 mg/dL — AB
NON-HDL CHOLESTEROL (CALC): 173 mg/dL — AB (ref <130)
Triglycerides: 180 mg/dL — ABNORMAL HIGH (ref <150)

## 2018-03-01 LAB — TSH: TSH: 2.76 mIU/L (ref 0.40–4.50)

## 2018-03-01 NOTE — Progress Notes (Signed)
Call pt: kidney and liver look great. TG decreased. HDL wonderful. LDL still elevated. Are you taking lipitor daily?

## 2018-03-02 ENCOUNTER — Encounter: Payer: Self-pay | Admitting: Physician Assistant

## 2018-04-03 ENCOUNTER — Encounter: Payer: Self-pay | Admitting: Emergency Medicine

## 2018-04-03 ENCOUNTER — Emergency Department (INDEPENDENT_AMBULATORY_CARE_PROVIDER_SITE_OTHER)
Admission: EM | Admit: 2018-04-03 | Discharge: 2018-04-03 | Disposition: A | Discharge status: Home / Self Care | Payer: Federal, State, Local not specified - PPO | Source: Home / Self Care | Attending: Family Medicine | Admitting: Family Medicine

## 2018-04-03 DIAGNOSIS — J069 Acute upper respiratory infection, unspecified: Secondary | ICD-10-CM

## 2018-04-03 DIAGNOSIS — B9789 Other viral agents as the cause of diseases classified elsewhere: Secondary | ICD-10-CM | POA: Diagnosis not present

## 2018-04-03 MED ORDER — AZITHROMYCIN 250 MG PO TABS: 250.0000 mg | ORAL_TABLET | Freq: Every day | ORAL | 0 refills | Status: DC

## 2018-04-03 MED ORDER — BENZONATATE 100 MG PO CAPS: 100.0000 mg | ORAL_CAPSULE | Freq: Three times a day (TID) | ORAL | 0 refills | Status: DC

## 2018-04-03 NOTE — Discharge Instructions (Signed)
°  Your symptoms are likely due to a virus such as the common cold, however, if you developing worsening chest congestion with shortness of breath, persistent fever (> 100.4*F) for 3 days, or symptoms not improving in 4-5 days, you may fill the antibiotic (azithromycin).  If you do fill the antibiotic,  please take antibiotics as prescribed and be sure to complete entire course even if you start to feel better to ensure infection does not come back. ° °

## 2018-04-03 NOTE — ED Provider Notes (Addendum)
[name]    CSN: [id number] Arrival date & time: 04/03/18  1403     History   Chief Complaint Chief Complaint  Patient presents with  . URI    HPI Gabriela Baker is a 72 y.o. female.   HPI  Gabriela Baker is a 72 y.o. female presenting to UC with c/o sore throat, swollen glands, productive cough and body aches that started yesterday. She had mild chest tightness earlier in the week, which improved with the use of her inhaler, though she denies hx of asthma or COPD.  No known sick contacts. She has taken ibuprofen today with moderate relief of pain.  Cough did keep her up last night.  HR elevated- 124, states she sometimes gets nervous at the doctor's office. Denies chest pain or palpitations.    Past Medical History:  Diagnosis Date  . Hyperlipidemia   . Thyroid disease     Patient Active Problem List   Diagnosis Date Noted  . Tobacco use disorder 06/25/2016  . Chronic obstructive pulmonary disease ([place]) 06/25/2016  . History of smoking greater than 50 pack years 06/25/2016  . Aortic atherosclerosis ([place]) 06/25/2016  . Hypertriglyceridemia 10/08/2015  . 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 History of anaphylaxis 04/15/2015  . Thyroid activity decreased 04/15/2015  . Tobacco dependence 04/15/2015  . Hyperlipidemia 04/15/2015  . Osteopenia 04/15/2015  . Post-menopausal 04/15/2015    History reviewed. No pertinent surgical history.  OB History   None      Home Medications    Prior to Admission medications   Medication Sig Start Date End Date Taking? Authorizing Provider  albuterol (PROVENTIL HFA;VENTOLIN HFA) 108 (90 Base) MCG/ACT inhaler Inhale 2 puffs into the lungs as needed for wheezing or shortness of breath. 04/06/16   Breeback, Jade L, PA-C  AMBULATORY NON FORMULARY MEDICATION shingrx 2 doses to prevent shingles. 02/28/18   Breeback, Jade L, PA-C  atorvastatin (LIPITOR) 20 MG tablet Take 1 tablet (20 mg total) by mouth daily. 02/28/18    Breeback, Jade L, PA-C  azithromycin (ZITHROMAX) 250 MG tablet Take 1 tablet (250 mg total) by mouth daily. Take first 2 tablets together, then 1 every day until finished. 04/03/18   Noe Gens, PA-C  benzonatate (TESSALON) 100 MG capsule Take 1-2 capsules (100-200 mg total) by mouth every 8 (eight) hours. 04/03/18   Noe Gens, PA-C  Calcium Citrate-Vitamin D (CALCIUM + D PO) Take by mouth daily.    [provider]  levothyroxine (SYNTHROID, LEVOTHROID) 50 MCG tablet Take 1 tablet (50 mcg total) by mouth daily before breakfast. 02/28/18   Breeback, Jade L, PA-C  Multiple Vitamins-Minerals (CENTRUM SILVER PO) Take by mouth daily.    [provider]  triamcinolone cream (KENALOG) 0.1 % Apply 1 application topically 3 (three) times daily. 10/24/17   Scot Jun, FNP  umeclidinium-vilanterol (ANORO ELLIPTA) 62.5-25 MCG/INH AEPB Inhale 1 puff into the lungs daily. 02/28/18   Donella Stade, PA-C    Family History No family history on file.  Social History Social History   Tobacco Use  . Smoking status: Current Every Day Smoker    Packs/day: 1.00    Years: 54.00    Pack years: 54.00    Types: Cigarettes  . Smokeless tobacco: Never Used  Substance Use Topics  . Alcohol use: Yes  . Drug use: No     Allergies   Bee venom   Review of Systems Review of Systems  Constitutional: Negative for chills  and fever.  HENT: Positive for congestion and sore throat. Negative for ear pain, trouble swallowing and voice change.   Respiratory: Positive for cough. Negative for shortness of breath.   Cardiovascular: Negative for chest pain and palpitations.  Gastrointestinal: Negative for abdominal pain, diarrhea, nausea and vomiting.  Musculoskeletal: Positive for arthralgias and myalgias. Negative for back pain.  Skin: Negative for rash.     Physical Exam Triage Vital Signs ED Triage Vitals  Enc Vitals Group     BP 04/03/18 1412 126/79     Pulse Rate 04/03/18  1412 (!) 124     Resp --      Temp 04/03/18 1412 98.5 F (36.9 C)     Temp Source 04/03/18 1412 Oral     SpO2 04/03/18 1412 100 %     Weight 04/03/18 1413 142 lb 8 oz (64.6 kg)     Height 04/03/18 1413 5\' 4"  (1.626 m)     Head Circumference --      Peak Flow --      Pain Score 04/03/18 1412 0     Pain Loc --      Pain Edu? --      Excl. in [place]? --    No data found.  Updated Vital Signs BP 126/79 (BP Location: Right Arm)   Pulse (!) 124   Temp 98.5 F (36.9 C) (Oral)   Ht 5\' 4"  (1.626 m)   Wt 142 lb 8 oz (64.6 kg)   SpO2 100%   BMI 24.46 kg/m   Visual Acuity Right Eye Distance:   Left Eye Distance:   Bilateral Distance:    Right Eye Near:   Left Eye Near:    Bilateral Near:     Physical Exam  Constitutional: She is oriented to person, place, and time. She appears well-developed and well-nourished. No distress.  HENT:  Head: Normocephalic and atraumatic.  Right Ear: Tympanic membrane normal.  Left Ear: Tympanic membrane normal.  Nose: Nose normal. Right sinus exhibits no maxillary sinus tenderness and no frontal sinus tenderness. Left sinus exhibits no maxillary sinus tenderness and no frontal sinus tenderness.  Mouth/Throat: Uvula is midline, oropharynx is clear and moist and mucous membranes are normal.  Eyes: EOM are normal.  Neck: Normal range of motion. Neck supple.  Cardiovascular: Normal rate and regular rhythm.  Pulmonary/Chest: Effort normal and breath sounds normal. No stridor. No respiratory distress. She has no wheezes. She has no rales.  Musculoskeletal: Normal range of motion.  Neurological: She is alert and oriented to person, place, and time.  Skin: Skin is warm and dry. She is not diaphoretic.  Psychiatric: She has a normal mood and affect. Her behavior is normal.  Nursing note and vitals reviewed.    UC Treatments / Results  Labs (all labs ordered are listed, but only abnormal results are displayed) Labs Reviewed - No data to  display  EKG None  Radiology No results found.  Procedures Procedures (including critical care time)  Medications Ordered in UC Medications - No data to display  Initial Impression / Assessment and Plan / UC Course  I have reviewed the triage vital signs and the nursing notes.  Pertinent labs & imaging results that were available during my care of the patient were reviewed by me and considered in my medical decision making (see chart for details).     Hx and exam c/w viral illness at this time. HR improved to 111bpm at time of discharge.  Encouraged symptomatic treatment.  Prescription to hold with expiration date for azithromycin. Home care instructions provided.   Final Clinical Impressions(s) / UC Diagnoses   Final diagnoses:  Viral URI with cough     Discharge Instructions      Your symptoms are likely due to a virus such as the common cold, however, if you developing worsening chest congestion with shortness of breath, persistent fever (>100.4*F) for 3 days, or symptoms not improving in 4-5 days, you may fill the antibiotic (azithromycin).  If you do fill the antibiotic,  please take antibiotics as prescribed and be sure to complete entire course even if you start to feel better to ensure infection does not come back.     ED Prescriptions    Medication Sig Dispense Auth. Provider   benzonatate (TESSALON) 100 MG capsule Take 1-2 capsules (100-200 mg total) by mouth every 8 (eight) hours. 21 capsule Gerarda Fraction, Celedonio Sortino O, PA-C   azithromycin (ZITHROMAX) 250 MG tablet Take 1 tablet (250 mg total) by mouth daily. Take first 2 tablets together, then 1 every day until finished. 6 tablet Noe Gens, PA-C     Controlled Substance Prescriptions [place] Controlled Substance Registry consulted? Not Applicable   Tyrell Antonio 04/03/18 1434    [address], [place] 04/03/18 1435

## 2018-04-03 NOTE — ED Triage Notes (Signed)
Patient c/o swollen glands, productive cough x 1 day, body aches

## 2018-04-18 ENCOUNTER — Ambulatory Visit: Payer: Federal, State, Local not specified - PPO | Admitting: Physician Assistant

## 2018-04-18 ENCOUNTER — Encounter: Payer: Self-pay | Admitting: Physician Assistant

## 2018-04-18 VITALS — BP 110/92 | HR 114 | Temp 99.0°F | Ht 64.0 in | Wt 142.0 lb

## 2018-04-18 DIAGNOSIS — J40 Bronchitis, not specified as acute or chronic: Secondary | ICD-10-CM

## 2018-04-18 DIAGNOSIS — J329 Chronic sinusitis, unspecified: Secondary | ICD-10-CM

## 2018-04-18 DIAGNOSIS — F17200 Nicotine dependence, unspecified, uncomplicated: Secondary | ICD-10-CM

## 2018-04-18 DIAGNOSIS — J411 Mucopurulent chronic bronchitis: Secondary | ICD-10-CM | POA: Diagnosis not present

## 2018-04-18 MED ORDER — NICOTINE 21 MG/24HR TD PT24: 21.0000 mg | MEDICATED_PATCH | Freq: Every day | TRANSDERMAL | 0 refills | Status: DC

## 2018-04-18 MED ORDER — ALBUTEROL SULFATE HFA 108 (90 BASE) MCG/ACT IN AERS: 2.0000 | INHALATION_SPRAY | Freq: Four times a day (QID) | RESPIRATORY_TRACT | 0 refills | Status: DC | PRN

## 2018-04-18 MED ORDER — PREDNISONE 20 MG PO TABS: ORAL_TABLET | ORAL | 0 refills | Status: DC

## 2018-04-18 NOTE — Progress Notes (Deleted)
   Subjective:    Patient ID: Gabriela Baker, female    DOB: 02/26/1946, 72 y.o.   MRN: [id number]  HPI 11/3 UC zpak   tobtassun mucinex. Tessalon pears  Low grade fever.     Review of Systems     Objective:   Physical Exam        Assessment & Plan:

## 2018-04-18 NOTE — Patient Instructions (Signed)
Chronic Obstructive Pulmonary Disease Exacerbation  Chronic obstructive pulmonary disease (COPD) is a common lung problem. In COPD, the flow of air from the lungs is limited. COPD exacerbations are times that breathing gets worse and you need extra treatment. Without treatment they can be life threatening. If they happen often, your lungs can become more damaged. If your COPD gets worse, your doctor may treat you with:  ? Medicines.  ? Oxygen.  ? Different ways to clear your airway, such as using a mask.    Follow these instructions at home:  ? Do not smoke.  ? Avoid tobacco smoke and other things that bother your lungs.  ? If given, take your antibiotic medicine as told. Finish the medicine even if you start to feel better.  ? Only take medicines as told by your doctor.  ? Drink enough fluids to keep your pee (urine) clear or pale yellow (unless your doctor has told you not to).  ? Use a cool mist machine (vaporizer).  ? If you use oxygen or a machine that turns liquid medicine into a mist (nebulizer), continue to use them as told.  ? Keep up with shots (vaccinations) as told by your doctor.  ? Exercise regularly.  ? Eat healthy foods.  ? Keep all doctor visits as told.  Get help right away if:  ? You are very short of breath and it gets worse.  ? You have trouble talking.  ? You have bad chest pain.  ? You have blood in your spit (sputum).  ? You have a fever.  ? You keep throwing up (vomiting).  ? You feel weak, or you pass out (faint).  ? You feel confused.  ? You keep getting worse.  This information is not intended to replace advice given to you by your health care provider. Make sure you discuss any questions you have with your health care provider.  Document Released: 05/07/2011 Document Revised: 10/24/2015 Document Reviewed: 01/20/2013  Elsevier Interactive Patient Education ? 2017 [employer] Inc[employer]

## 2018-04-19 NOTE — Progress Notes (Signed)
l °

## 2018-04-19 NOTE — Progress Notes (Addendum)
Subjective:    Patient ID: Gabriela Baker, female    DOB: [birth year], 72 y.o.   MRN: [id number]  HPI This is a 72 year old female presenting with a chief complaint of "feeling yucky for the past couple weeks". She complains of facial congestion, with moderate tenderness in the frontal sinuses and mild tenderness in the maxillary sinuses. She has a productive cough w/ white foamy sputum. She also complains of intermittent headache. She reports diarrhea starting yesterday, but denies foul odor and abnormal color.  She went to Urgent Care on 11/3 and was prescribed azithromycin and Tessalon Per[place]. She reports no improvement with either therapy. She has also tried Robitussin and Mucinex, and reports relief, however she ran out of Robitussin and discontinued Mucinex after she experienced dyspepsia.   Denies any fever, chills, body aches, SOB.  [name]. Active Ambulatory Problems    Diagnosis Date Noted  . History of anaphylaxis 04/15/2015  . Thyroid activity decreased 04/15/2015  . Tobacco dependence 04/15/2015  . Hyperlipidemia 04/15/2015  . Osteopenia 04/15/2015  . Post-menopausal 04/15/2015  . 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 Hypertriglyceridemia 10/08/2015  . Tobacco use disorder 06/25/2016  . Chronic obstructive pulmonary disease ([place]) 06/25/2016  . History of smoking greater than 50 pack years 06/25/2016  . Aortic atherosclerosis ([place]) 06/25/2016   Resolved Ambulatory Problems    Diagnosis Date Noted  . No Resolved Ambulatory Problems   Past Medical History:  Diagnosis Date  . Thyroid disease      Review of Systems  Constitutional: Positive for fatigue.  HENT: Positive for congestion, postnasal drip, sinus pressure and sinus pain.   Respiratory: Positive for cough. Negative for shortness of breath and wheezing.   Gastrointestinal: Positive for diarrhea. Negative for blood in stool, nausea and vomiting.  Neurological: Positive for headaches.       Objective:   Physical Exam   Constitutional: She appears well-developed and well-nourished. No distress.  HENT:  Head: Normocephalic.  Right Ear: External ear normal.  Left Ear: External ear normal.  Mouth/Throat: No oropharyngeal exudate.  Eyes: Conjunctivae are normal. Right eye exhibits no discharge. Left eye exhibits no discharge.  Neck: No thyromegaly present.  Cardiovascular: Normal rate, regular rhythm and normal heart sounds. Exam reveals no gallop and no friction rub.  No murmur heard. Pulmonary/Chest: Effort normal and breath sounds normal. She has no wheezes. She has no rales.  Abdominal: Soft. There is no tenderness. There is no guarding.          Assessment & Plan:  Marland KitchenMarland KitchenDiagnoses and all orders for this visit:  Sinobronchitis -     albuterol (PROVENTIL HFA;VENTOLIN HFA) 108 (90 Base) MCG/ACT inhaler; Inhale 2 puffs into the lungs every 6 (six) hours as needed for wheezing or shortness of breath. -     predniSONE (DELTASONE) 20 MG tablet; Take 3 tablets for 3 days, take 2 tablets for 3 days, take 1 tablet for 3 days, take 1/2 tablet for 4 days.  Mucopurulent chronic bronchitis (HCC) -     predniSONE (DELTASONE) 20 MG tablet; Take 3 tablets for 3 days, take 2 tablets for 3 days, take 1 tablet for 3 days, take 1/2 tablet for 4 days.  Current every day smoker -     nicotine (NICODERM CQ) 21 mg/24hr patch; Place 1 patch (21 mg total) onto the skin daily.   Pt just finished a zpak. I think her symptoms represent more inflammation than infection. Will treat with prednisone and rescue albuterol.   Pt  is ready to stop smoking. Discussed options. She opts for nicoderm patches. Sent over 21mg  and follow up in 1 month with the goal to taper cigs every few days until stopped.   Marland KitchenVernetta Honey PA-C, have reviewed and agree with the above documentation in it's entirety.

## 2018-05-16 ENCOUNTER — Ambulatory Visit (INDEPENDENT_AMBULATORY_CARE_PROVIDER_SITE_OTHER): Payer: Federal, State, Local not specified - PPO | Admitting: Physician Assistant

## 2018-05-16 ENCOUNTER — Encounter: Payer: Self-pay | Admitting: Physician Assistant

## 2018-05-16 VITALS — BP 139/77 | HR 109 | Ht 64.0 in | Wt 146.0 lb

## 2018-05-16 DIAGNOSIS — Z87891 Personal history of nicotine dependence: Secondary | ICD-10-CM

## 2018-05-16 DIAGNOSIS — F17200 Nicotine dependence, unspecified, uncomplicated: Secondary | ICD-10-CM

## 2018-05-16 DIAGNOSIS — J411 Mucopurulent chronic bronchitis: Secondary | ICD-10-CM

## 2018-05-16 MED ORDER — NICOTINE 21 MG/24HR TD PT24: 21.0000 mg | MEDICATED_PATCH | Freq: Every day | TRANSDERMAL | 2 refills | Status: DC

## 2018-05-16 NOTE — Progress Notes (Signed)
   Subjective:    Patient ID: Gabriela Baker, female    DOB: 1945/06/11, 72 y.o.   MRN: [id number]  HPI  Pt is a 72 yo female with COPD who presents to the clinic to follow up on 1 month of trying to quit smoking. She has been wearing the patches and only smoked one cigarette in 4 weeks. She is breathing so much better and not coughing as much. Her last low dose CT was 06/2016. She is still having quite a bit of cravings. She does not feel like she can cut back on patches. She is also more sleepy and eating more. She has gained 4lbs in the last 4 weeks.   .. Active Ambulatory Problems    Diagnosis Date Noted  . History of anaphylaxis 04/15/2015  . Thyroid activity decreased 04/15/2015  . Tobacco dependence 04/15/2015  . Hyperlipidemia 04/15/2015  . Osteopenia 04/15/2015  . Post-menopausal 04/15/2015  . 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 Hypertriglyceridemia 10/08/2015  . Tobacco use disorder 06/25/2016  . Chronic obstructive pulmonary disease ([place]) 06/25/2016  . History of smoking greater than 50 pack years 06/25/2016  . Aortic atherosclerosis ([place]) 06/25/2016   Resolved Ambulatory Problems    Diagnosis Date Noted  . No Resolved Ambulatory Problems   Past Medical History:  Diagnosis Date  . Thyroid disease      Review of Systems See HPI.     Objective:   Physical Exam Cardiovascular:     Rate and Rhythm: Normal rate.  Pulmonary:     Effort: Pulmonary effort is normal.  Neurological:     Mental Status: She is alert.  Psychiatric:        Mood and Affect: Mood normal.        Behavior: Behavior normal.           Assessment & Plan:  Marland KitchenMarland KitchenBatoul was seen today for follow-up.  Diagnoses and all orders for this visit:  Mucopurulent chronic bronchitis ([place]) -     Ambulatory Referral for Lung Cancer Scre  Current every day smoker -     nicotine (NICODERM CQ) 21 mg/24hr patch; Place 1 patch (21 mg total) onto the skin daily.  History of smoking greater than 50 pack  years -     Ambulatory Referral for Lung Cancer Scre   Doing well. Will stay in 21mg  patch for next 2 months then attemp to decrease to 14mg  patch. Discussed cravings and how to push through. Pt is due for annual low dose lung cancer screening. Last CT was 06/2016.

## 2018-07-18 ENCOUNTER — Ambulatory Visit: Payer: Federal, State, Local not specified - PPO | Admitting: Physician Assistant

## 2018-07-18 ENCOUNTER — Encounter: Payer: Self-pay | Admitting: Physician Assistant

## 2018-07-18 VITALS — BP 128/82 | HR 101 | Ht 64.0 in | Wt 149.0 lb

## 2018-07-18 DIAGNOSIS — R000 Tachycardia, unspecified: Secondary | ICD-10-CM | POA: Diagnosis not present

## 2018-07-18 DIAGNOSIS — Z87891 Personal history of nicotine dependence: Secondary | ICD-10-CM | POA: Diagnosis not present

## 2018-07-18 DIAGNOSIS — J449 Chronic obstructive pulmonary disease, unspecified: Secondary | ICD-10-CM

## 2018-07-18 MED ORDER — NICOTINE 14 MG/24HR TD PT24: 14.0000 mg | MEDICATED_PATCH | Freq: Every day | TRANSDERMAL | 0 refills | Status: DC

## 2018-07-18 MED ORDER — NICOTINE 21 MG/24HR TD PT24: 21.0000 mg | MEDICATED_PATCH | Freq: Every day | TRANSDERMAL | 1 refills | Status: DC

## 2018-07-18 MED ORDER — UMECLIDINIUM-VILANTEROL 62.5-25 MCG/INH IN AEPB: 1.0000 | INHALATION_SPRAY | Freq: Every day | RESPIRATORY_TRACT | 2 refills | Status: DC

## 2018-07-18 NOTE — Progress Notes (Signed)
Subjective:    Patient ID: Gabriela Baker, female    DOB: [birth year], 73 y.o.   MRN: [id number]  HPI  Patient is a 74 year old former smoker with COPD who presents to the clinic to follow-up on smoking cessation.  She is on the 21 mg patch and doing well.  She has been on the patch for 2 months and she admits to use 1 or 2 cigarettes about a 3 weeks ago.  She has been completely stop for the last 2 weeks.  She continues to have cravings.  She does feel much better.  Her shortness of breath is improved.  Her cough is improved.  She denies any heart palpitations, chest pain, headache, feeling of tired or weakness.  .. Active Ambulatory Problems    Diagnosis Date Noted  . History of anaphylaxis 04/15/2015  . Thyroid activity decreased 04/15/2015  . Tobacco dependence 04/15/2015  . Hyperlipidemia 04/15/2015  . Osteopenia 04/15/2015  . Post-menopausal 04/15/2015  . 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 Hypertriglyceridemia 10/08/2015  . Tobacco use disorder 06/25/2016  . Chronic obstructive pulmonary disease ([place]) 06/25/2016  . History of smoking greater than 50 pack years 06/25/2016  . Aortic atherosclerosis ([place]) 06/25/2016  . Former smoker 07/18/2018  . Tachycardia 07/18/2018   Resolved Ambulatory Problems    Diagnosis Date Noted  . No Resolved Ambulatory Problems   Past Medical History:  Diagnosis Date  . Thyroid disease      Review of Systems See HPI>     Objective:   Physical Exam Vitals signs reviewed.  Constitutional:      Appearance: Normal appearance.  HENT:     Head: Normocephalic and atraumatic.  Cardiovascular:     Rate and Rhythm: Regular rhythm. Tachycardia present.     Pulses: Normal pulses.  Pulmonary:     Effort: Pulmonary effort is normal.  Neurological:     Mental Status: She is alert.           Assessment & Plan:  Marland KitchenMarland KitchenMarguriete was seen today for follow-up.  Diagnoses and all orders for this visit:  Former smoker -     nicotine (NICODERM CQ) 21  mg/24hr patch; Place 1 patch (21 mg total) onto the skin daily. -     nicotine (NICODERM CQ) 14 mg/24hr patch; Place 1 patch (14 mg total) onto the skin daily.  Chronic obstructive pulmonary disease, unspecified COPD type ([place]) -     umeclidinium-vilanterol (ANORO ELLIPTA) 62.5-25 MCG/INH AEPB; Inhale 1 puff into the lungs daily.  History of smoking greater than 50 pack years -     umeclidinium-vilanterol (ANORO ELLIPTA) 62.5-25 MCG/INH AEPB; Inhale 1 puff into the lungs daily.  Tachycardia   Patient has gone a week without smoking.  She continues to be on the NicoDerm 21 mg patch.  I do not think she is quite ready to taper down to the 14 mg patch.  I continued the 21 mg patch for another month or 2.  I printed out the 14 mg patch for her to start.  I would like for her to try to make this transition in the next 3 months.  Discussed she could keep some Nicorette gum on hand for when she has a bad craving.  Do not want her to pick back up her cigarettes.  She may continue on a [place] daily.   She could not afford the low-dose CT scanning screening for lung cancer.  Her heart rate was elevated.  She denies any symptoms of this.  She does feel like she has been more anxious since stopping smoking.  She wonders if this could contribute.  Certainly this could.  It did go down upon recheck.  Discussed signs and symptoms of increased heart rate.  If in 3 months there is no improvement we will consider other interventions.  If she becomes symptomatic please follow-up sooner.

## 2018-08-11 ENCOUNTER — Encounter: Payer: Self-pay | Admitting: Physician Assistant

## 2018-10-13 ENCOUNTER — Encounter: Payer: Self-pay | Admitting: Physician Assistant

## 2018-10-17 ENCOUNTER — Ambulatory Visit: Payer: Federal, State, Local not specified - PPO | Admitting: Physician Assistant

## 2018-11-05 ENCOUNTER — Encounter: Payer: Self-pay | Admitting: Physician Assistant

## 2018-11-05 DIAGNOSIS — Z87891 Personal history of nicotine dependence: Secondary | ICD-10-CM

## 2018-11-07 MED ORDER — NICOTINE 7 MG/24HR TD PT24: 7.0000 mg | MEDICATED_PATCH | Freq: Every day | TRANSDERMAL | 0 refills | Status: DC

## 2019-02-02 ENCOUNTER — Other Ambulatory Visit: Payer: Self-pay | Admitting: Physician Assistant

## 2019-02-02 DIAGNOSIS — E782 Mixed hyperlipidemia: Secondary | ICD-10-CM

## 2019-02-02 DIAGNOSIS — E781 Pure hyperglyceridemia: Secondary | ICD-10-CM

## 2019-02-02 DIAGNOSIS — E039 Hypothyroidism, unspecified: Secondary | ICD-10-CM

## 2019-03-03 ENCOUNTER — Telehealth: Payer: Self-pay | Admitting: Physician Assistant

## 2019-03-03 DIAGNOSIS — E039 Hypothyroidism, unspecified: Secondary | ICD-10-CM

## 2019-03-03 DIAGNOSIS — E782 Mixed hyperlipidemia: Secondary | ICD-10-CM

## 2019-03-03 DIAGNOSIS — E781 Pure hyperglyceridemia: Secondary | ICD-10-CM

## 2019-03-03 DIAGNOSIS — Z131 Encounter for screening for diabetes mellitus: Secondary | ICD-10-CM

## 2019-03-03 NOTE — Telephone Encounter (Signed)
Pt called.  She will run out of her cholesterol med and her thyroid med on Wednesday.  Can you do lab order so that she can get her refills.  I will call her to get her schedule physical scheduled.  Thanks.

## 2019-03-03 NOTE — Telephone Encounter (Signed)
Thank you. Patient went to the lab today to  get her bloodwork done.

## 2019-03-03 NOTE — Telephone Encounter (Signed)
Labs ordered.will refill medication accordingly.

## 2019-03-04 LAB — COMPLETE METABOLIC PANEL WITH GFR
AG Ratio: 1.8 (calc) (ref 1.0–2.5)
ALT: 49 U/L — ABNORMAL HIGH (ref 6–29)
AST: 40 U/L — ABNORMAL HIGH (ref 10–35)
Albumin: 4.7 g/dL (ref 3.6–5.1)
Alkaline phosphatase (APISO): 82 U/L (ref 37–153)
BUN: 9 mg/dL (ref 7–25)
CO2: 28 mmol/L (ref 20–32)
Calcium: 10.4 mg/dL (ref 8.6–10.4)
Chloride: 102 mmol/L (ref 98–110)
Creat: 0.8 mg/dL (ref 0.60–0.93)
GFR, Est African American: 85 mL/min/{1.73_m2} (ref >=60)
GFR, Est Non African American: 73 mL/min/{1.73_m2} (ref >=60)
Globulin: 2.6 g/dL (calc) (ref 1.9–3.7)
Glucose, Bld: 101 mg/dL — ABNORMAL HIGH (ref 65–99)
Potassium: 5 mmol/L (ref 3.5–5.3)
Sodium: 140 mmol/L (ref 135–146)
Total Bilirubin: 0.5 mg/dL (ref 0.2–1.2)
Total Protein: 7.3 g/dL (ref 6.1–8.1)

## 2019-03-04 LAB — LIPID PANEL W/REFLEX DIRECT LDL
Cholesterol: 221 mg/dL — ABNORMAL HIGH (ref <200)
HDL: 82 mg/dL (ref >=50)
LDL Cholesterol (Calc): 105 mg/dL (calc) — ABNORMAL HIGH
Non-HDL Cholesterol (Calc): 139 mg/dL (calc) — ABNORMAL HIGH (ref <130)
Total CHOL/HDL Ratio: 2.7 (calc) (ref <5.0)
Triglycerides: 227 mg/dL — ABNORMAL HIGH (ref <150)

## 2019-03-04 LAB — TSH: TSH: 2.84 mIU/L (ref 0.40–4.50)

## 2019-03-06 ENCOUNTER — Other Ambulatory Visit: Payer: Self-pay | Admitting: Neurology

## 2019-03-06 DIAGNOSIS — R748 Abnormal levels of other serum enzymes: Secondary | ICD-10-CM

## 2019-03-06 MED ORDER — ATORVASTATIN CALCIUM 20 MG PO TABS: 20.0000 mg | ORAL_TABLET | Freq: Every day | ORAL | 3 refills | Status: DC

## 2019-03-06 MED ORDER — LEVOTHYROXINE SODIUM 50 MCG PO TABS: 50.0000 ug | ORAL_TABLET | Freq: Every day | ORAL | 3 refills | Status: DC

## 2019-03-06 NOTE — Telephone Encounter (Signed)
Call pt: LDL looks a lot better. TG are more elevated. Are you taking fish oil? If not start 4000mg  daily. Watch sugars and carbs.   Thyroid looks good.   Liver enzymes are up some. Are you taking more tylenol? Drinking alcohol? Stop both and recheck in 4 weeks.

## 2019-03-06 NOTE — Addendum Note (Signed)
Addended by: Donella Stade on: 03/06/2019 06:47 AM   Modules accepted: Orders

## 2019-03-07 ENCOUNTER — Encounter: Payer: Federal, State, Local not specified - PPO | Admitting: Family Medicine

## 2019-03-15 ENCOUNTER — Ambulatory Visit: Payer: Federal, State, Local not specified - PPO | Admitting: Physician Assistant

## 2019-03-20 ENCOUNTER — Encounter: Payer: Self-pay | Admitting: Physician Assistant

## 2019-03-20 ENCOUNTER — Ambulatory Visit (INDEPENDENT_AMBULATORY_CARE_PROVIDER_SITE_OTHER): Payer: Federal, State, Local not specified - PPO | Admitting: Physician Assistant

## 2019-03-20 ENCOUNTER — Encounter: Payer: Federal, State, Local not specified - PPO | Admitting: Physician Assistant

## 2019-03-20 ENCOUNTER — Other Ambulatory Visit: Payer: Self-pay

## 2019-03-20 VITALS — BP 126/68 | HR 112 | Ht 64.0 in | Wt 152.0 lb

## 2019-03-20 DIAGNOSIS — Z780 Asymptomatic menopausal state: Secondary | ICD-10-CM

## 2019-03-20 DIAGNOSIS — L821 Other seborrheic keratosis: Secondary | ICD-10-CM

## 2019-03-20 DIAGNOSIS — R748 Abnormal levels of other serum enzymes: Secondary | ICD-10-CM

## 2019-03-20 DIAGNOSIS — Z23 Encounter for immunization: Secondary | ICD-10-CM

## 2019-03-20 DIAGNOSIS — Z13820 Encounter for screening for osteoporosis: Secondary | ICD-10-CM

## 2019-03-20 MED ORDER — AMBULATORY NON FORMULARY MEDICATION: 0 refills | Status: DC

## 2019-03-20 NOTE — Patient Instructions (Signed)
Over the counter fish oil 4000mg  a day.    High Triglycerides Eating Plan Triglycerides are a type of fat in the blood. High levels of triglycerides can increase your risk of heart disease and stroke. If your triglyceride levels are high, choosing the right foods can help lower your triglycerides and keep your heart healthy. Work with your health care provider or a diet and nutrition specialist (dietitian) to develop an eating plan that is right for you. What are tips for following this plan? General guidelines   Lose weight, if you are overweight. For most people, losing 5-10 lbs (2-5 kg) helps lower triglyceride levels. A weight-loss plan may include. ? 30 minutes of exercise at least 5 days a week. ? Reducing the amount of calories, sugar, and fat you eat.  Eat a wide variety of fresh fruits, vegetables, and whole grains. These foods are high in fiber.  Eat foods that contain healthy fats, such as fatty fish, nuts, seeds, and olive oil.  Avoid foods that are high in added sugar, added salt (sodium), saturated fat, and trans fat.  Avoid low-fiber, refined carbohydrates such as white bread, crackers, noodles, and white rice.  Avoid foods with partially hydrogenated oils (trans fats), such as fried foods or stick margarine.  Limit alcohol intake to no more than 1 drink a day for nonpregnant women and 2 drinks a day for men. One drink equals 12 oz of beer, 5 oz of wine, or 1 oz of hard liquor. Your health care provider may recommend that you drink less depending on your overall health. Reading food labels  Check food labels for the amount of saturated fat. Choose foods with no or very little saturated fat.  Check food labels for the amount of trans fat. Choose foods with no trans fat.  Check food labels for the amount of cholesterol. Choose foods low in cholesterol. Ask your dietitian how much cholesterol you should have each day.  Check food labels for the amount of sodium. Choose  foods with less than 140 milligrams (mg) per serving. Shopping  Buy dairy products labeled as nonfat (skim) or low-fat (1%).  Avoid buying processed or prepackaged foods. These are often high in added sugar, sodium, and fat. Cooking  Choose healthy fats when cooking, such as olive oil or canola oil.  Cook foods using lower fat methods, such as baking, broiling, boiling, or grilling.  Make your own sauces, dressings, and marinades when possible, instead of buying them. Store-bought sauces, dressings, and marinades are often high in sodium and sugar. Meal planning  Eat more home-cooked food and less restaurant, buffet, and fast food.  Eat fatty fish at least 2 times each week. Examples of fatty fish include salmon, trout, mackerel, tuna, and herring.  If you eat whole eggs, do not eat more than 3 egg yolks per week. What foods are recommended? The items listed may not be a complete list. Talk with your dietitian about what dietary choices are best for you. Grains Whole wheat or whole grain breads, crackers, cereals, and pasta. Unsweetened oatmeal. Bulgur. Barley. Quinoa. Brown rice. Whole wheat flour tortillas. Vegetables Fresh or frozen vegetables. Low-sodium canned vegetables. Fruits All fresh, canned (in natural juice), or frozen fruits. Meats and other protein foods Skinless chicken or [place]. Ground chicken or [place]. Lean cuts of pork, trimmed of fat. Fish and seafood, especially salmon, trout, and herring. Egg whites. Dried beans, peas, or lentils. Unsalted nuts or seeds. Unsalted canned beans. Natural peanut or almond butter.  Dairy Low-fat dairy products. Skim or low-fat (1%) milk. Reduced fat (2%) and low-sodium cheese. Low-fat ricotta cheese. Low-fat cottage cheese. Plain, low-fat yogurt. Fats and oils Tub margarine without trans fats. Light or reduced-fat mayonnaise. Light or reduced-fat salad dressings. Avocado. Safflower, olive, sunflower, soybean, and canola oils. What  foods are not recommended? The items listed may not be a complete list. Talk with your dietitian about what dietary choices are best for you. Grains White bread. White (regular) pasta. White rice. Cornbread. Bagels. Pastries. Crackers that contain trans fat. Vegetables Creamed or fried vegetables. Vegetables in a cheese sauce. Fruits Sweetened dried fruit. Canned fruit in syrup. Fruit juice. Meats and other protein foods Fatty cuts of meat. Ribs. Chicken wings. Berniece Salines. Sausage. Bologna. Salami. Chitterlings. Fatback. Hot dogs. Bratwurst. Packaged lunch meats. Dairy Whole or reduced-fat (2%) milk. Half-and-half. Cream cheese. Full-fat or sweetened yogurt. Full-fat cheese. Nondairy creamers. Whipped toppings. Processed cheese or cheese spreads. Cheese curds. Beverages Alcohol. Sweetened drinks, such as soda, lemonade, fruit drinks, or punches. Fats and oils Butter. Stick margarine. Lard. Shortening. Ghee. Bacon fat. Tropical oils, such as coconut, palm kernel, or palm oils. Sweets and desserts Corn syrup. Sugars. Honey. Molasses. Candy. Jam and jelly. Syrup. Sweetened cereals. Cookies. Pies. Cakes. Donuts. Muffins. Ice cream. Condiments Store-bought sauces, dressings, and marinades that are high in sugar, such as ketchup and barbecue sauce. Summary  High levels of triglycerides can increase the risk of heart disease and stroke. Choosing the right foods can help lower your triglycerides.  Eat plenty of fresh fruits, vegetables, and whole grains. Choose low-fat dairy and lean meats. Eat fatty fish at least twice a week.  Avoid processed and prepackaged foods with added sugar, sodium, saturated fat, and trans fat.  If you need suggestions or have questions about what types of food are good for you, talk with your health care provider or a dietitian. This information is not intended to replace advice given to you by your health care provider. Make sure you discuss any questions you have with  your health care provider. Document Released: 03/05/2004 Document Revised: 04/30/2017 Document Reviewed: 07/21/2016 Elsevier Patient Education  2020 [place] Maintenance After Age 27 After age 34, you are at a higher risk for certain long-term diseases and infections as well as injuries from falls. Falls are a major cause of broken bones and head injuries in people who are older than age 59. Getting regular preventive care can help to keep you healthy and well. Preventive care includes getting regular testing and making lifestyle changes as recommended by your health care provider. Talk with your health care provider about:  Which screenings and tests you should have. A screening is a test that checks for a disease when you have no symptoms.  A diet and exercise plan that is right for you. What should I know about screenings and tests to prevent falls? Screening and testing are the best ways to find a health problem early. Early diagnosis and treatment give you the best chance of managing medical conditions that are common after age 71. Certain conditions and lifestyle choices may make you more likely to have a fall. Your health care provider may recommend:  Regular vision checks. Poor vision and conditions such as cataracts can make you more likely to have a fall. If you wear glasses, make sure to get your prescription updated if your vision changes.  Medicine review. Work with your health care provider to regularly review all of the medicines  you are taking, including over-the-counter medicines. Ask your health care provider about any side effects that may make you more likely to have a fall. Tell your health care provider if any medicines that you take make you feel dizzy or sleepy.  Osteoporosis screening. Osteoporosis is a condition that causes the bones to get weaker. This can make the bones weak and cause them to break more easily.  Blood pressure screening. Blood pressure  changes and medicines to control blood pressure can make you feel dizzy.  Strength and balance checks. Your health care provider may recommend certain tests to check your strength and balance while standing, walking, or changing positions.  Foot health exam. Foot pain and numbness, as well as not wearing proper footwear, can make you more likely to have a fall.  Depression screening. You may be more likely to have a fall if you have a fear of falling, feel emotionally low, or feel unable to do activities that you used to do.  Alcohol use screening. Using too much alcohol can affect your balance and may make you more likely to have a fall. What actions can I take to lower my risk of falls? General instructions  Talk with your health care provider about your risks for falling. Tell your health care provider if: ? You fall. Be sure to tell your health care provider about all falls, even ones that seem minor. ? You feel dizzy, sleepy, or off-balance.  Take over-the-counter and prescription medicines only as told by your health care provider. These include any supplements.  Eat a healthy diet and maintain a healthy weight. A healthy diet includes low-fat dairy products, low-fat (lean) meats, and fiber from whole grains, beans, and lots of fruits and vegetables. Home safety  Remove any tripping hazards, such as rugs, cords, and clutter.  Install safety equipment such as grab bars in bathrooms and safety rails on stairs.  Keep rooms and walkways well-lit. Activity   Follow a regular exercise program to stay fit. This will help you maintain your balance. Ask your health care provider what types of exercise are appropriate for you.  If you need a cane or walker, use it as recommended by your health care provider.  Wear supportive shoes that have nonskid soles. Lifestyle  Do not drink alcohol if your health care provider tells you not to drink.  If you drink alcohol, limit how much you  have: ? 0-1 drink a day for women. ? 0-2 drinks a day for men.  Be aware of how much alcohol is in your drink. In the [place] one drink equals one typical bottle of beer (12 oz), one-half glass of wine (5 oz), or one shot of hard liquor (1 oz).  Do not use any products that contain nicotine or tobacco, such as cigarettes and e-cigarettes. If you need help quitting, ask your health care provider. Summary  Having a healthy lifestyle and getting preventive care can help to protect your health and wellness after age 51.  Screening and testing are the best way to find a health problem early and help you avoid having a fall. Early diagnosis and treatment give you the best chance for managing medical conditions that are more common for people who are older than age 11.  Falls are a major cause of broken bones and head injuries in people who are older than age 81. Take precautions to prevent a fall at home.  Work with your health care provider to learn what changes  you can make to improve your health and wellness and to prevent falls. This information is not intended to replace advice given to you by your health care provider. Make sure you discuss any questions you have with your health care provider. Document Released: 03/31/2017 Document Revised: 09/08/2018 Document Reviewed: 03/31/2017 Elsevier Patient Education  2020 [employer].

## 2019-03-20 NOTE — Progress Notes (Signed)
Patches

## 2019-03-21 LAB — HEPATIC FUNCTION PANEL
AG Ratio: 1.7 (calc) (ref 1.0–2.5)
ALT: 58 U/L — ABNORMAL HIGH (ref 6–29)
AST: 45 U/L — ABNORMAL HIGH (ref 10–35)
Albumin: 4.3 g/dL (ref 3.6–5.1)
Alkaline phosphatase (APISO): 87 U/L (ref 37–153)
Bilirubin, Direct: 0.1 mg/dL (ref 0.0–0.2)
Globulin: 2.6 g/dL (calc) (ref 1.9–3.7)
Indirect Bilirubin: 0.3 mg/dL (calc) (ref 0.2–1.2)
Total Bilirubin: 0.4 mg/dL (ref 0.2–1.2)
Total Protein: 6.9 g/dL (ref 6.1–8.1)

## 2019-03-22 ENCOUNTER — Encounter: Payer: Federal, State, Local not specified - PPO | Admitting: Physician Assistant

## 2019-03-24 NOTE — Progress Notes (Signed)
Liver enzymes still elevated. Need to order acute hepatitis panel, Prothrombin time. Cbc with platelets and ultrasound.

## 2019-03-27 ENCOUNTER — Other Ambulatory Visit: Payer: Self-pay | Admitting: Neurology

## 2019-04-02 ENCOUNTER — Encounter: Payer: Self-pay | Admitting: Physician Assistant

## 2019-04-06 ENCOUNTER — Ambulatory Visit (HOSPITAL_COMMUNITY): Payer: Federal, State, Local not specified - PPO

## 2019-05-31 ENCOUNTER — Encounter: Payer: Self-pay | Admitting: Physician Assistant

## 2019-06-13 DIAGNOSIS — H2181 Floppy iris syndrome: Secondary | ICD-10-CM | POA: Insufficient documentation

## 2019-06-20 DIAGNOSIS — H25812 Combined forms of age-related cataract, left eye: Secondary | ICD-10-CM | POA: Insufficient documentation

## 2019-08-03 ENCOUNTER — Telehealth: Payer: Self-pay | Admitting: Physician Assistant

## 2019-08-03 NOTE — Telephone Encounter (Signed)
PT requested bloodwork to be sent in. "It was bloodwork that I needed done but forgot to do." She didn't specify what kind she needed. Apologies for the lack of info.

## 2019-08-03 NOTE — Telephone Encounter (Signed)
Spoke with patient. Made her aware lab request from October still active. She will go to lab to have done.

## 2019-08-04 LAB — CBC WITH DIFFERENTIAL/PLATELET
Absolute Monocytes: 502 cells/uL (ref 200–950)
Basophils Absolute: 79 cells/uL (ref 0–200)
Basophils Relative: 0.9 %
Eosinophils Absolute: 264 cells/uL (ref 15–500)
Eosinophils Relative: 3 %
HCT: 42.1 % (ref 35.0–45.0)
Hemoglobin: 14.4 g/dL (ref 11.7–15.5)
Lymphs Abs: 3846 cells/uL (ref 850–3900)
MCH: 33.1 pg — ABNORMAL HIGH (ref 27.0–33.0)
MCHC: 34.2 g/dL (ref 32.0–36.0)
MCV: 96.8 fL (ref 80.0–100.0)
MPV: 10.8 fL (ref 7.5–12.5)
Monocytes Relative: 5.7 %
Neutro Abs: 4110 cells/uL (ref 1500–7800)
Neutrophils Relative %: 46.7 %
Platelets: 275 10*3/uL (ref 140–400)
RBC: 4.35 10*6/uL (ref 3.80–5.10)
RDW: 12.3 % (ref 11.0–15.0)
Total Lymphocyte: 43.7 %
WBC: 8.8 10*3/uL (ref 3.8–10.8)

## 2019-08-04 LAB — HEPATITIS PANEL, ACUTE
Hep A IgM: NONREACTIVE
Hep B C IgM: NONREACTIVE
Hepatitis B Surface Ag: NONREACTIVE
Hepatitis C Ab: NONREACTIVE
SIGNAL TO CUT-OFF: 0.02 (ref <1.00)

## 2019-08-04 LAB — PROTIME-INR
INR: 1
Prothrombin Time: 10.2 s (ref 9.0–11.5)

## 2019-08-07 NOTE — Progress Notes (Signed)
Gabriela Baker,   Hepatitis panel negative.  No anemia.  WBC looks great.  INR fine.   Need to get the ultrasound have you been called.

## 2019-08-09 ENCOUNTER — Encounter: Payer: Self-pay | Admitting: Physician Assistant

## 2019-08-19 ENCOUNTER — Encounter: Payer: Self-pay | Admitting: Physician Assistant

## 2019-08-21 MED ORDER — NICOTINE 21 MG/24HR TD PT24: 21.0000 mg | MEDICATED_PATCH | Freq: Every day | TRANSDERMAL | 0 refills | Status: DC

## 2019-08-23 NOTE — Telephone Encounter (Signed)
Ok to send order there, please and let patient know how to contact them.

## 2019-08-24 NOTE — Telephone Encounter (Signed)
Per Phone note from Apolonio Schneiders -- No Auth Required and pts now wants this scheduled with [facility] [phone] and Fax [phone] so I have faxed order and demograophics and requested they schedule with patient for [facility] location

## 2019-08-24 NOTE — Telephone Encounter (Signed)
[facility] does not require pre certification.   Note to [facility] for scheduling

## 2019-09-10 ENCOUNTER — Other Ambulatory Visit: Payer: Self-pay | Admitting: Physician Assistant

## 2019-09-10 DIAGNOSIS — J329 Chronic sinusitis, unspecified: Secondary | ICD-10-CM

## 2019-09-10 DIAGNOSIS — J40 Bronchitis, not specified as acute or chronic: Secondary | ICD-10-CM

## 2019-09-18 ENCOUNTER — Other Ambulatory Visit: Payer: Self-pay | Admitting: Physician Assistant

## 2019-09-19 MED ORDER — NICOTINE 21 MG/24HR TD PT24: 21.0000 mg | MEDICATED_PATCH | Freq: Every day | TRANSDERMAL | 0 refills | Status: DC

## 2019-10-18 ENCOUNTER — Encounter: Payer: Self-pay | Admitting: Physician Assistant

## 2019-10-18 DIAGNOSIS — Z87892 Personal history of anaphylaxis: Secondary | ICD-10-CM

## 2019-10-19 MED ORDER — NICOTINE 21 MG/24HR TD PT24: 21.0000 mg | MEDICATED_PATCH | Freq: Every day | TRANSDERMAL | 0 refills | Status: DC

## 2019-10-19 NOTE — Telephone Encounter (Signed)
Nicotine patch sent for one month.  Epipen pended. Can you sign?

## 2019-10-20 MED ORDER — EPINEPHRINE 0.3 MG/0.3ML IJ SOAJ: 0.3000 mg | Freq: Once | INTRAMUSCULAR | 0 refills | Status: DC

## 2019-11-16 ENCOUNTER — Other Ambulatory Visit: Payer: Self-pay | Admitting: Physician Assistant

## 2019-11-22 ENCOUNTER — Telehealth: Payer: Self-pay | Admitting: Physician Assistant

## 2019-11-22 DIAGNOSIS — R748 Abnormal levels of other serum enzymes: Secondary | ICD-10-CM

## 2019-11-22 NOTE — Telephone Encounter (Signed)
Liver ultrasound results are back and shows some mild risk of fibrosis. No significant findings.

## 2019-11-23 NOTE — Telephone Encounter (Signed)
Left message on machine for patient to call back for results.

## 2019-11-24 NOTE — Telephone Encounter (Signed)
Patient made aware of results.  

## 2019-12-13 ENCOUNTER — Encounter: Payer: Self-pay | Admitting: Physician Assistant

## 2019-12-15 MED ORDER — NICOTINE 14 MG/24HR TD PT24: 14.0000 mg | MEDICATED_PATCH | Freq: Every day | TRANSDERMAL | 1 refills | Status: DC

## 2020-01-17 ENCOUNTER — Encounter: Payer: Self-pay | Admitting: Physician Assistant

## 2020-02-09 ENCOUNTER — Telehealth: Payer: Self-pay | Admitting: Neurology

## 2020-02-09 NOTE — Telephone Encounter (Signed)
Prednisone should not treat scabies at all. Scabies dx is made by burrows in the skin and itching in the webs of fingers or on skin. I would complete prednisone and look for any changes and then come back in.

## 2020-02-09 NOTE — Telephone Encounter (Signed)
Patient left vm stating she went to the beach with family, was seen in an urgent care and diagnosed with rash/allergic reaction. She was given Prednisone. Her other family member was seen and diagnosed with scabies. She wants to know if she should be seen here? Please advise.

## 2020-02-09 NOTE — Telephone Encounter (Signed)
Patient made aware.

## 2020-02-09 NOTE — Telephone Encounter (Signed)
Did the rash clear? Is she still itching? Does she see any bites in the webs of fingers etc?

## 2020-02-09 NOTE — Telephone Encounter (Signed)
Spoke with patient. She states she is still on prednisone. Rash has almost cleared she has one "bump" on arm. Never anything between her fingers. Did have itching but that has all subsided. She is concerned when she is done with prednisone it will come back if misdiagnosed. Please advise.

## 2020-02-12 ENCOUNTER — Encounter: Payer: Self-pay | Admitting: Physician Assistant

## 2020-02-13 ENCOUNTER — Other Ambulatory Visit: Payer: Self-pay

## 2020-02-13 ENCOUNTER — Emergency Department
Admission: RE | Admit: 2020-02-13 | Discharge: 2020-02-13 | Disposition: A | Discharge status: Home / Self Care | Payer: Federal, State, Local not specified - PPO | Source: Ambulatory Visit | Attending: Family Medicine | Admitting: Family Medicine

## 2020-02-13 VITALS — BP 143/85 | HR 103 | Temp 99.0°F | Resp 17

## 2020-02-13 DIAGNOSIS — R21 Rash and other nonspecific skin eruption: Secondary | ICD-10-CM | POA: Diagnosis not present

## 2020-02-13 MED ORDER — TRIAMCINOLONE ACETONIDE 0.1 % EX CREA: 1.0000 "application " | TOPICAL_CREAM | Freq: Two times a day (BID) | CUTANEOUS | 0 refills | Status: DC

## 2020-02-13 MED ORDER — NICOTINE 7 MG/24HR TD PT24: 7.0000 mg | MEDICATED_PATCH | Freq: Every day | TRANSDERMAL | 2 refills | Status: DC

## 2020-02-13 NOTE — ED Triage Notes (Signed)
Red raised small bumps x 3 days - worse at night - "itches" Bumps noted to BLE & R arm Pt stated she had a rash when she was at the beach and was put on prednisone - last dose thi sam  Per pt rash is not the same [facility] vaccine June 2021

## 2020-02-13 NOTE — ED Provider Notes (Signed)
[name]    CSN: [id number] Arrival date & time: 02/13/20  1045      History   Chief Complaint Chief Complaint  Patient presents with  . Appointment  . Insect Bite    HPI Gabriela Baker is a 74 y.o. female.   Patient complains of onset 3 days ago of tiny pruritic bumps on both lower legs and right arm.  She feels well and has no other symptoms.  She states that while at the beach recently she developed a rash that resolved with prednisone.  She states that her present rash is different.   The history is provided by the patient.  Rash Location: lower legs and right arm. Quality: dryness, itchiness and redness   Quality: not blistering, not bruising, not burning, not draining, not painful, not peeling, not scaling, not swelling and not weeping   Severity:  Mild Onset quality:  Sudden Duration:  3 days Timing:  Constant Progression:  Unchanged Chronicity:  New Context: not animal contact, not chemical exposure, not exposure to similar rash, not food, not hot tub use, not insect bite/sting, not medications, not new detergent/soap, not nuts and not plant contact   Relieved by:  None tried Worsened by:  Nothing Ineffective treatments:  None tried Associated symptoms: no diarrhea, no fatigue, no fever, no headaches, no induration, no joint pain, no myalgias, no nausea, no periorbital edema, no shortness of breath, no sore throat, no throat swelling, no tongue swelling and no URI     Past Medical History:  Diagnosis Date  . Hyperlipidemia   . Thyroid disease     Patient Active Problem List   Diagnosis Date Noted  . Elevated liver enzymes 11/22/2019  . Combined forms of age-related cataract of left eye 06/20/2019  . 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 Former smoker 07/18/2018  . Tachycardia 07/18/2018  . Tobacco use disorder 06/25/2016  . Chronic obstructive pulmonary disease ([place]) 06/25/2016  . History of smoking greater than 50  pack years 06/25/2016  . Aortic atherosclerosis ([place]) 06/25/2016  . Hypertriglyceridemia 10/08/2015  . 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 History of anaphylaxis 04/15/2015  . Thyroid activity decreased 04/15/2015  . Tobacco dependence 04/15/2015  . Hyperlipidemia 04/15/2015  . Osteopenia 04/15/2015  . Post-menopausal 04/15/2015    History reviewed. No pertinent surgical history.  OB History   No obstetric history on file.      Home Medications    Prior to Admission medications   Medication Sig Start Date End Date Taking? Authorizing Provider  albuterol (VENTOLIN HFA) 108 (90 Base) MCG/ACT inhaler TAKE 2 PUFFS BY MOUTH EVERY 6 HOURS AS NEEDED FOR WHEEZE OR SHORTNESS OF BREATH 09/11/19  Yes Breeback, Jade L, PA-C  aspirin EC 81 MG tablet Take 81 mg by mouth daily.   Yes [provider]  atorvastatin (LIPITOR) 20 MG tablet Take 1 tablet (20 mg total) by mouth daily. 03/06/19  Yes Breeback, Jade L, PA-C  Calcium Citrate-Vitamin D (CALCIUM + D PO) Take by mouth daily.   Yes [provider]  levothyroxine (SYNTHROID) 50 MCG tablet Take 1 tablet (50 mcg total) by mouth daily before breakfast. 03/06/19  Yes Breeback, Jade L, PA-C  Multiple Vitamins-Minerals (CENTRUM SILVER PO) Take by mouth daily.   Yes [provider]  AMBULATORY NON FORMULARY MEDICATION shingrx 2 doses to prevent shingles. 03/20/19   Breeback, Jade L, PA-C  nicotine (NICODERM CQ - DOSED IN MG/24 HR) 7 mg/24hr patch Place 1 patch (7  mg total) onto the skin daily. 02/13/20   Breeback, Jade L, PA-C  triamcinolone cream (KENALOG) 0.1 % Apply 1 application topically 2 (two) times daily. 02/13/20   Kandra Nicolas, MD  umeclidinium-vilanterol (ANORO ELLIPTA) 62.5-25 MCG/INH AEPB Inhale 1 puff into the lungs daily. 07/18/18   Donella Stade, PA-C    Family History Family History  Problem Relation Age of Onset  . Cancer Mother   . Heart failure Father   . Cancer Father     Social History Social  History   Tobacco Use  . Smoking status: Former Smoker    Packs/day: 1.00    Years: 54.00    Pack years: 54.00    Types: Cigarettes    Quit date: 09/02/2019    Years since quitting: 0.4  . Smokeless tobacco: Never Used  Substance Use Topics  . Alcohol use: Yes    Alcohol/week: 3.0 standard drinks    Types: 3 Glasses of wine per week    Comment: 21 glasses a week  . Drug use: No     Allergies   Bee venom   Review of Systems Review of Systems  Constitutional: Negative for chills, diaphoresis, fatigue and fever.  HENT: Negative for sore throat.   Respiratory: Negative for shortness of breath.   Gastrointestinal: Negative for diarrhea and nausea.  Musculoskeletal: Negative for arthralgias and myalgias.  Skin: Positive for rash.  Neurological: Negative for headaches.  All other systems reviewed and are negative.    Physical Exam Triage Vital Signs ED Triage Vitals  Enc Vitals Group     BP 02/13/20 1127 (!) 143/85     Pulse Rate 02/13/20 1127 (!) 103     Resp 02/13/20 1127 17     Temp 02/13/20 1127 99 F (37.2 C)     Temp Source 02/13/20 1127 Oral     SpO2 02/13/20 1127 99 %     Weight --      Height --      Head Circumference --      Peak Flow --      Pain Score 02/13/20 1132 0     Pain Loc --      Pain Edu? --      Excl. in [place]? --    No data found.  Updated Vital Signs BP (!) 143/85 (BP Location: Right Arm)   Pulse (!) 103 Comment: pt states she gets nervous at the doctor's  Temp 99 F (37.2 C) (Oral)   Resp 17   SpO2 99%   Visual Acuity Right Eye Distance:   Left Eye Distance:   Bilateral Distance:    Right Eye Near:   Left Eye Near:    Bilateral Near:     Physical Exam Skin:    Comments: Lower legs have several widely scattered punctate non-specific maculo-papular lesions.  There are several on right forearm.      UC Treatments / Results  Labs (all labs ordered are listed, but only abnormal results are displayed) Labs Reviewed - No  data to display  EKG   Radiology No results found.  Procedures Procedures (including critical care time)  Medications Ordered in UC Medications - No data to display  Initial Impression / Assessment and Plan / UC Course  I have reviewed the triage vital signs and the nursing notes.  Pertinent labs & imaging results that were available during my care of the patient were reviewed by me and considered in my medical decision making (see chart  for details).    Because of minimal number of skin lesions, will Rx triamcinolone cream BID. Followup dermatologist if not improving about 10 days.   Final Clinical Impressions(s) / UC Diagnoses   Final diagnoses:  Rash and nonspecific skin eruption     Discharge Instructions      Minimize baths/showers.  Use a mild bath soap containing oil such as unscented Dove.  Apply a moisturizing cream or lotion immediately after bathing while still wet, then towel dry.     ED Prescriptions    Medication Sig Dispense Auth. Provider   triamcinolone cream (KENALOG) 0.1 % Apply 1 application topically 2 (two) times daily. 30 g Kandra Nicolas, MD        Kandra Nicolas, MD 02/15/20 [phone]

## 2020-02-13 NOTE — Discharge Instructions (Addendum)
Minimize baths/showers.  Use a mild bath soap containing oil such as unscented Dove.  Apply a moisturizing cream or lotion immediately after bathing while still wet, then towel dry.

## 2020-03-06 ENCOUNTER — Other Ambulatory Visit: Payer: Self-pay | Admitting: Physician Assistant

## 2020-03-06 DIAGNOSIS — E781 Pure hyperglyceridemia: Secondary | ICD-10-CM

## 2020-03-06 DIAGNOSIS — E039 Hypothyroidism, unspecified: Secondary | ICD-10-CM

## 2020-03-06 DIAGNOSIS — E782 Mixed hyperlipidemia: Secondary | ICD-10-CM

## 2020-03-15 ENCOUNTER — Encounter: Payer: Self-pay | Admitting: Physician Assistant

## 2020-03-29 ENCOUNTER — Ambulatory Visit (INDEPENDENT_AMBULATORY_CARE_PROVIDER_SITE_OTHER): Payer: Federal, State, Local not specified - PPO | Admitting: Physician Assistant

## 2020-03-29 ENCOUNTER — Encounter: Payer: Self-pay | Admitting: Physician Assistant

## 2020-03-29 VITALS — BP 130/81 | HR 92 | Temp 98.1°F | Wt 151.8 lb

## 2020-03-29 DIAGNOSIS — E039 Hypothyroidism, unspecified: Secondary | ICD-10-CM

## 2020-03-29 DIAGNOSIS — I700 Atherosclerosis of aorta: Secondary | ICD-10-CM

## 2020-03-29 DIAGNOSIS — E782 Mixed hyperlipidemia: Secondary | ICD-10-CM

## 2020-03-29 DIAGNOSIS — Z0000 Encounter for general adult medical examination without abnormal findings: Secondary | ICD-10-CM

## 2020-03-29 DIAGNOSIS — Z1231 Encounter for screening mammogram for malignant neoplasm of breast: Secondary | ICD-10-CM | POA: Diagnosis not present

## 2020-03-29 DIAGNOSIS — Z1211 Encounter for screening for malignant neoplasm of colon: Secondary | ICD-10-CM

## 2020-03-29 DIAGNOSIS — J411 Mucopurulent chronic bronchitis: Secondary | ICD-10-CM

## 2020-03-29 DIAGNOSIS — Z131 Encounter for screening for diabetes mellitus: Secondary | ICD-10-CM

## 2020-03-29 DIAGNOSIS — Z23 Encounter for immunization: Secondary | ICD-10-CM

## 2020-03-29 NOTE — Progress Notes (Signed)
Subjective:   Lakeithia Rasor Hinz is a 74 y.o. female who presents for Medicare Annual (Subsequent) preventive examination.  Review of Systems    No concerns.        Objective:    Today's Vitals   03/29/20 1536 03/29/20 1600  BP: 130/81   Pulse: (!) 112 92  Temp: 98.1 F (36.7 C)   TempSrc: Oral   Weight: 151 lb 12.8 oz (68.9 kg)    Body mass index is 26.06 kg/m.  Advanced Directives 04/15/2015  Does Patient Have a Medical Advance Directive? Yes  Type of Paramedic of [place];Living will    Current Medications (verified) Outpatient Encounter Medications as of 03/29/2020  Medication Sig  . albuterol (VENTOLIN HFA) 108 (90 Base) MCG/ACT inhaler TAKE 2 PUFFS BY MOUTH EVERY 6 HOURS AS NEEDED FOR WHEEZE OR SHORTNESS OF BREATH  . AMBULATORY NON FORMULARY MEDICATION shingrx 2 doses to prevent shingles.  [name] aspirin EC 81 MG tablet Take 81 mg by mouth daily.  [name] atorvastatin (LIPITOR) 20 MG tablet Take 1 tablet (20 mg total) by mouth daily. Need labs  . Calcium Citrate-Vitamin D (CALCIUM + D PO) Take by mouth daily.  [name] levothyroxine (SYNTHROID) 50 MCG tablet Take 1 tablet (50 mcg total) by mouth daily before breakfast. Labs for refills  . Multiple Vitamins-Minerals (CENTRUM SILVER PO) Take by mouth daily.  . nicotine (NICODERM CQ - DOSED IN MG/24 HR) 7 mg/24hr patch Place 1 patch (7 mg total) onto the skin daily.  [name] triamcinolone cream (KENALOG) 0.1 % Apply 1 application topically 2 (two) times daily.  [name] umeclidinium-vilanterol (ANORO ELLIPTA) 62.5-25 MCG/INH AEPB Inhale 1 puff into the lungs daily.   No facility-administered encounter medications on file as of 03/29/2020.    Allergies (verified) Bee venom   History: Past Medical History:  Diagnosis Date  . Hyperlipidemia   . Thyroid disease    History reviewed. No pertinent surgical history. Family History  Problem Relation Age of Onset  . Cancer Mother   . Heart failure Father   . Cancer  Father    Social History   Socioeconomic History  . Marital status: Divorced    Spouse name: Not on file  . Number of children: Not on file  . Years of education: Not on file  . Highest education level: Not on file  Occupational History  . Not on file  Tobacco Use  . Smoking status: Former Smoker    Packs/day: 1.00    Years: 54.00    Pack years: 54.00    Types: Cigarettes    Quit date: 09/02/2019    Years since quitting: 0.5  . Smokeless tobacco: Never Used  Substance and Sexual Activity  . Alcohol use: Yes    Alcohol/week: 3.0 standard drinks    Types: 3 Glasses of wine per week    Comment: 21 glasses a week  . Drug use: No  . Sexual activity: Not Currently  Other Topics Concern  . Not on file  Social History Narrative  . Not on file   Social Determinants of Health   Financial Resource Strain:   . Difficulty of Paying Living Expenses: Not on file  Food Insecurity:   . Worried About Charity fundraiser in the Last Year: Not on file  . Ran Out of Food in the Last Year: Not on file  Transportation Needs:   . Lack of Transportation (Medical): Not on file  . Lack of Transportation (Non-Medical): Not on  file  Physical Activity:   . Days of Exercise per Week: Not on file  . Minutes of Exercise per Session: Not on file  Stress:   . Feeling of Stress : Not on file  Social Connections:   . Frequency of Communication with Friends and Family: Not on file  . Frequency of Social Gatherings with Friends and Family: Not on file  . Attends Religious Services: Not on file  . Active Member of Clubs or Organizations: Not on file  . Attends Archivist Meetings: Not on file  . Marital Status: Not on file    Tobacco Counseling Counseling given: Not Answered   Clinical Intake:                Diabetic?negative         Activities of Daily Living In your present state of health, do you have any difficulty performing the following activities: 04/01/2020   Hearing? N  Vision? N  Difficulty concentrating or making decisions? N  Walking or climbing stairs? N  Dressing or bathing? N  Doing errands, shopping? N  Some recent data might be hidden    Patient Care Team: Lavada Mesi as PCP - General (Family Medicine)  Indicate any recent Medical Services you may have received from other than [facility] providers in the past year (date may be approximate).     Assessment:   This is a routine wellness examination for [place].  Hearing/Vision screen No exam data present  Dietary issues and exercise activities discussed:    Goals   None    Depression Screen PHQ 2/9 Scores 03/29/2020 03/20/2019 02/28/2018 03/05/2017  PHQ - 2 Score 0 0 0 0  PHQ- 9 Score - 0 0 -    Fall Risk Fall Risk  03/29/2020  Falls in the past year? 0    Any stairs in or around the home? No  If so, are there any without handrails? N/A Home free of loose throw rugs in walkways, pet beds, electrical cords, etc? Yes  Adequate lighting in your home to reduce risk of falls? Yes   ASSISTIVE DEVICES UTILIZED TO PREVENT FALLS:  Life alert? No  Use of a cane, walker or w/c? No  Grab bars in the bathroom? No  Shower chair or bench in shower? No  Elevated toilet seat or a handicapped toilet? No   TIMED UP AND GO:  Was the test performed? Yes .  Length of time to ambulate 10 feet: 3 sec.   Gait steady and fast without use of assistive device  Cognitive Function:     6CIT Screen 03/29/2020  What Year? 0 points  What month? 0 points  What time? 0 points  Count back from 20 0 points  Months in reverse 0 points  Repeat phrase 0 points  Total Score 0    Immunizations Immunization History  Administered Date(s) Administered  . Fluad Quad(high Dose 65+) 03/07/2019, 03/29/2020  . Influenza, High Dose Seasonal PF 05/05/2016, 03/05/2017, 03/10/2019  . Influenza,inj,Quad PF,6+ Mos 04/15/2015, 02/28/2018  . Moderna SARS-COVID-2 Vaccination 09/22/2019,  10/20/2019  . Pneumococcal Conjugate-13 04/15/2015  . Pneumococcal Polysaccharide-23 03/05/2017  . Tdap 06/12/2013    TDAP status: Up to date Flu Vaccine status: Up to date Pneumococcal vaccine status: Up to date Covid-19 vaccine status: Completed vaccines  Qualifies for Shingles Vaccine? Yes   Zostavax completed Yes   Shingrix Completed?: Yes  Screening Tests Health Maintenance  Topic Date Due  . MAMMOGRAM  01/04/2013  .  Fecal DNA (Cologuard)  03/21/2020  . TETANUS/TDAP  06/13/2023  . INFLUENZA VACCINE  Completed  . DEXA SCAN  Completed  . COVID-19 Vaccine  Completed  . Hepatitis C Screening  Completed  . PNA vac Low Risk Adult  Completed    Health Maintenance  Health Maintenance Due  Topic Date Due  . MAMMOGRAM  01/04/2013  . Fecal DNA (Cologuard)  03/21/2020    Colorectal cancer screening: Completed cologuard 2018. Repeat every 3 years Mammogram status: Completed 2012. ordered today. . Repeat every year Bone Density status: Completed 03/2019. Results reflect: Bone density results: OSTEOPENIA. Repeat every 2 years.  Lung Cancer Screening: (Low Dose CT Chest recommended if Age 59-80 years, 30 pack-year currently smoking OR have quit w/in 15years.) does qualify.   Lung Cancer Screening Referral: done  Additional Screening:  Hepatitis C Screening: does qualify; Completed 08/03/2019  Vision Screening: Recommended annual ophthalmology exams for early detection of glaucoma and other disorders of the eye. Is the patient up to date with their annual eye exam?  Yes  Who is the provider or what is the name of the office in which the patient attends annual eye exams? My eye doctor If pt is not established with a provider, would they like to be referred to a provider to establish care? No .   Dental Screening: Recommended annual dental exams for proper oral hygiene  Community Resource Referral / Chronic Care Management:    Plan:     I have personally reviewed and  noted the following in the patient's chart:   . Medical and social history . Use of alcohol, tobacco or illicit drugs  . Current medications and supplements . Functional ability and status . Nutritional status . Physical activity . Advanced directives . List of other physicians . Hospitalizations, surgeries, and ER visits in previous 12 months . Vitals . Screenings to include cognitive, depression, and falls . Referrals and appointments  In addition, I have reviewed and discussed with patient certain preventive protocols, quality metrics, and best practice recommendations. A written personalized care plan for preventive services as well as general preventive health recommendations were provided to patient.   preventative labs ordered.  colo guard ordered.  Mammogram ordered.  Ok to get booster of covid vaccine.   Iran Planas, PA-C  03/29/2020

## 2020-03-29 NOTE — Patient Instructions (Signed)
Health Maintenance After Age 75 After age 75, you are at a higher risk for certain long-term diseases and infections as well as injuries from falls. Falls are a major cause of broken bones and head injuries in people who are older than age 75. Getting regular preventive care can help to keep you healthy and well. Preventive care includes getting regular testing and making lifestyle changes as recommended by your health care provider. Talk with your health care provider about:  Which screenings and tests you should have. A screening is a test that checks for a disease when you have no symptoms.  A diet and exercise plan that is right for you. What should I know about screenings and tests to prevent falls? Screening and testing are the best ways to find a health problem early. Early diagnosis and treatment give you the best chance of managing medical conditions that are common after age 75. Certain conditions and lifestyle choices may make you more likely to have a fall. Your health care provider may recommend:  Regular vision checks. Poor vision and conditions such as cataracts can make you more likely to have a fall. If you wear glasses, make sure to get your prescription updated if your vision changes.  Medicine review. Work with your health care provider to regularly review all of the medicines you are taking, including over-the-counter medicines. Ask your health care provider about any side effects that may make you more likely to have a fall. Tell your health care provider if any medicines that you take make you feel dizzy or sleepy.  Osteoporosis screening. Osteoporosis is a condition that causes the bones to get weaker. This can make the bones weak and cause them to break more easily.  Blood pressure screening. Blood pressure changes and medicines to control blood pressure can make you feel dizzy.  Strength and balance checks. Your health care provider may recommend certain tests to check your  strength and balance while standing, walking, or changing positions.  Foot health exam. Foot pain and numbness, as well as not wearing proper footwear, can make you more likely to have a fall.  Depression screening. You may be more likely to have a fall if you have a fear of falling, feel emotionally low, or feel unable to do activities that you used to do.  Alcohol use screening. Using too much alcohol can affect your balance and may make you more likely to have a fall. What actions can I take to lower my risk of falls? General instructions  Talk with your health care provider about your risks for falling. Tell your health care provider if: ? You fall. Be sure to tell your health care provider about all falls, even ones that seem minor. ? You feel dizzy, sleepy, or off-balance.  Take over-the-counter and prescription medicines only as told by your health care provider. These include any supplements.  Eat a healthy diet and maintain a healthy weight. A healthy diet includes low-fat dairy products, low-fat (lean) meats, and fiber from whole grains, beans, and lots of fruits and vegetables. Home safety  Remove any tripping hazards, such as rugs, cords, and clutter.  Install safety equipment such as grab bars in bathrooms and safety rails on stairs.  Keep rooms and walkways well-lit. Activity   Follow a regular exercise program to stay fit. This will help you maintain your balance. Ask your health care provider what types of exercise are appropriate for you.  If you need a cane or   walker, use it as recommended by your health care provider.  Wear supportive shoes that have nonskid soles. Lifestyle  Do not drink alcohol if your health care provider tells you not to drink.  If you drink alcohol, limit how much you have: ? 0-1 drink a day for women. ? 0-2 drinks a day for men.  Be aware of how much alcohol is in your drink. In the [place] one drink equals one typical bottle of beer (12  oz), one-half glass of wine (5 oz), or one shot of hard liquor (1 oz).  Do not use any products that contain nicotine or tobacco, such as cigarettes and e-cigarettes. If you need help quitting, ask your health care provider. Summary  Having a healthy lifestyle and getting preventive care can help to protect your health and wellness after age 75.  Screening and testing are the best way to find a health problem early and help you avoid having a fall. Early diagnosis and treatment give you the best chance for managing medical conditions that are more common for people who are older than age 75.  Falls are a major cause of broken bones and head injuries in people who are older than age 75. Take precautions to prevent a fall at home.  Work with your health care provider to learn what changes you can make to improve your health and wellness and to prevent falls. This information is not intended to replace advice given to you by your health care provider. Make sure you discuss any questions you have with your health care provider. Document Revised: 09/08/2018 Document Reviewed: 03/31/2017 Elsevier Patient Education  2020 [employer]  

## 2020-04-01 ENCOUNTER — Encounter: Payer: Self-pay | Admitting: Physician Assistant

## 2020-04-02 ENCOUNTER — Encounter: Payer: Self-pay | Admitting: Physician Assistant

## 2020-04-02 LAB — LIPID PANEL W/REFLEX DIRECT LDL
Cholesterol: 213 mg/dL — ABNORMAL HIGH (ref <200)
HDL: 73 mg/dL (ref >=50)
LDL Cholesterol (Calc): 108 mg/dL (calc) — ABNORMAL HIGH
Non-HDL Cholesterol (Calc): 140 mg/dL (calc) — ABNORMAL HIGH (ref <130)
Total CHOL/HDL Ratio: 2.9 (calc) (ref <5.0)
Triglycerides: 199 mg/dL — ABNORMAL HIGH (ref <150)

## 2020-04-02 LAB — COMPLETE METABOLIC PANEL WITH GFR
AG Ratio: 1.7 (calc) (ref 1.0–2.5)
ALT: 36 U/L — ABNORMAL HIGH (ref 6–29)
AST: 31 U/L (ref 10–35)
Albumin: 4.3 g/dL (ref 3.6–5.1)
Alkaline phosphatase (APISO): 75 U/L (ref 37–153)
BUN: 13 mg/dL (ref 7–25)
CO2: 30 mmol/L (ref 20–32)
Calcium: 9.8 mg/dL (ref 8.6–10.4)
Chloride: 104 mmol/L (ref 98–110)
Creat: 0.68 mg/dL (ref 0.60–0.93)
GFR, Est African American: 100 mL/min/{1.73_m2} (ref >=60)
GFR, Est Non African American: 86 mL/min/{1.73_m2} (ref >=60)
Globulin: 2.5 g/dL (calc) (ref 1.9–3.7)
Glucose, Bld: 92 mg/dL (ref 65–99)
Potassium: 4.5 mmol/L (ref 3.5–5.3)
Sodium: 141 mmol/L (ref 135–146)
Total Bilirubin: 0.5 mg/dL (ref 0.2–1.2)
Total Protein: 6.8 g/dL (ref 6.1–8.1)

## 2020-04-02 LAB — TSH: TSH: 4.57 mIU/L — ABNORMAL HIGH (ref 0.40–4.50)

## 2020-04-02 NOTE — Progress Notes (Signed)
[name],   ALT and AST have improved.  Kidney and glucose look good.  HDL great.  LDL and TG  not to optimal goal. I would like to increase lipitor to 40mg . Are you ok with increase?  TSH elevated. If taking levothyroxine daily in the morning with no food. Then we need to increase dose and recheck in 6 weeks.

## 2020-04-03 MED ORDER — ATORVASTATIN CALCIUM 40 MG PO TABS: 40.0000 mg | ORAL_TABLET | Freq: Every day | ORAL | 3 refills | Status: DC

## 2020-04-03 MED ORDER — LEVOTHYROXINE SODIUM 75 MCG PO TABS: 75.0000 ug | ORAL_TABLET | Freq: Every day | ORAL | 2 refills | Status: DC

## 2020-04-09 LAB — COLOGUARD: Cologuard: NEGATIVE

## 2020-04-17 LAB — EXTERNAL GENERIC LAB PROCEDURE: COLOGUARD: NEGATIVE

## 2020-04-17 LAB — COLOGUARD: COLOGUARD: NEGATIVE

## 2020-04-23 ENCOUNTER — Ambulatory Visit (INDEPENDENT_AMBULATORY_CARE_PROVIDER_SITE_OTHER): Payer: Federal, State, Local not specified - PPO

## 2020-04-23 ENCOUNTER — Encounter: Payer: Self-pay | Admitting: Physician Assistant

## 2020-04-23 ENCOUNTER — Other Ambulatory Visit: Payer: Self-pay

## 2020-04-23 ENCOUNTER — Ambulatory Visit (INDEPENDENT_AMBULATORY_CARE_PROVIDER_SITE_OTHER): Payer: Federal, State, Local not specified - PPO | Admitting: Physician Assistant

## 2020-04-23 VITALS — BP 137/90 | HR 104 | Ht 64.0 in | Wt 157.0 lb

## 2020-04-23 DIAGNOSIS — M1812 Unilateral primary osteoarthritis of first carpometacarpal joint, left hand: Secondary | ICD-10-CM

## 2020-04-23 DIAGNOSIS — M79645 Pain in left finger(s): Secondary | ICD-10-CM

## 2020-04-23 MED ORDER — DICLOFENAC SODIUM 1 % EX GEL: 4.0000 g | Freq: Four times a day (QID) | CUTANEOUS | 5 refills | Status: AC

## 2020-04-23 MED ORDER — MELOXICAM 15 MG PO TABS: 15.0000 mg | ORAL_TABLET | Freq: Every day | ORAL | 1 refills | Status: DC

## 2020-04-23 NOTE — Patient Instructions (Signed)

## 2020-04-23 NOTE — Progress Notes (Addendum)
Acute Office Visit  Subjective:    Patient ID: Gabriela Baker, female    DOB: [birth year], 74 y.o.   MRN: [id number]  Chief Complaint  Patient presents with  . Hand Pain    Hand Pain  The incident occurred 5 to 7 days ago. There was no injury mechanism. The pain is present in the left wrist and left hand. The quality of the pain is described as aching. The pain does not radiate. The pain is at a severity of 7/10. The pain has been fluctuating since the incident. Pertinent negatives include no muscle weakness, numbness or tingling. The symptoms are aggravated by movement. She has tried NSAIDs and heat for the symptoms. The treatment provided no relief.   Patient is in today for left base of thumb pain that began while playing cards. Pain worsens through the day and is a 7 or 8 out of ten by the time she goes to bed.   Past Medical History:  Diagnosis Date  . Hyperlipidemia   . Thyroid disease     History reviewed. No pertinent surgical history.  Family History  Problem Relation Age of Onset  . Cancer Mother   . Heart failure Father   . Cancer Father     Social History   Socioeconomic History  . Marital status: Divorced    Spouse name: Not on file  . Number of children: Not on file  . Years of education: Not on file  . Highest education level: Not on file  Occupational History  . Not on file  Tobacco Use  . Smoking status: Former Smoker    Packs/day: 1.00    Years: 54.00    Pack years: 54.00    Types: Cigarettes    Quit date: 09/02/2019    Years since quitting: 0.6  . Smokeless tobacco: Never Used  Substance and Sexual Activity  . Alcohol use: Yes    Alcohol/week: 3.0 standard drinks    Types: 3 Glasses of wine per week    Comment: 21 glasses a week  . Drug use: No  . Sexual activity: Not Currently  Other Topics Concern  . Not on file  Social History Narrative  . Not on file   Social Determinants of Health   Financial Resource Strain:   . Difficulty of  Paying Living Expenses: Not on file  Food Insecurity:   . Worried About Charity fundraiser in the Last Year: Not on file  . Ran Out of Food in the Last Year: Not on file  Transportation Needs:   . Lack of Transportation (Medical): Not on file  . Lack of Transportation (Non-Medical): Not on file  Physical Activity:   . Days of Exercise per Week: Not on file  . Minutes of Exercise per Session: Not on file  Stress:   . Feeling of Stress : Not on file  Social Connections:   . Frequency of Communication with Friends and Family: Not on file  . Frequency of Social Gatherings with Friends and Family: Not on file  . Attends Religious Services: Not on file  . Active Member of Clubs or Organizations: Not on file  . Attends Archivist Meetings: Not on file  . Marital Status: Not on file  Intimate Partner Violence:   . Fear of Current or Ex-Partner: Not on file  . Emotionally Abused: Not on file  . Physically Abused: Not on file  . Sexually Abused: Not on file    Outpatient  Medications Prior to Visit  Medication Sig Dispense Refill  . albuterol (VENTOLIN HFA) 108 (90 Base) MCG/ACT inhaler TAKE 2 PUFFS BY MOUTH EVERY 6 HOURS AS NEEDED FOR WHEEZE OR SHORTNESS OF BREATH 6.7 g 2  . aspirin EC 81 MG tablet Take 81 mg by mouth daily.    [name] atorvastatin (LIPITOR) 40 MG tablet Take 1 tablet (40 mg total) by mouth daily. 90 tablet 3  . Calcium Citrate-Vitamin D (CALCIUM + D PO) Take by mouth daily.    [name] levothyroxine (SYNTHROID) 75 MCG tablet Take 1 tablet (75 mcg total) by mouth daily. 30 tablet 2  . Multiple Vitamins-Minerals (CENTRUM SILVER PO) Take by mouth daily.    . nicotine (NICODERM CQ - DOSED IN MG/24 HR) 7 mg/24hr patch Place 1 patch (7 mg total) onto the skin daily. 28 patch 2  . triamcinolone cream (KENALOG) 0.1 % Apply 1 application topically 2 (two) times daily. 30 g 0  . umeclidinium-vilanterol (ANORO ELLIPTA) 62.5-25 MCG/INH AEPB Inhale 1 puff into the lungs daily. 60 each 2   . AMBULATORY NON FORMULARY MEDICATION shingrx 2 doses to prevent shingles. 2 application 0   No facility-administered medications prior to visit.    Allergies  Allergen Reactions  . Bee Venom Other (See Comments)    Review of Systems  Neurological: Negative for tingling and numbness.       Objective:    Physical Exam Vitals reviewed.  Constitutional:      Appearance: Normal appearance.  Cardiovascular:     Rate and Rhythm: Normal rate.  Pulmonary:     Effort: Pulmonary effort is normal.  Musculoskeletal:     Comments: Pain to palpation over CMC and radial wrist. No warmth or redness.  Negative finkelstein.  Hand drip 4/5.  Thumb strength 4/5.    Neurological:     General: No focal deficit present.     Mental Status: She is alert and oriented to person, place, and time.  Psychiatric:        Mood and Affect: Mood normal.       Assessment & Plan:  Marland KitchenMarland KitchenYaretsi was seen today for hand pain.  Diagnoses and all orders for this visit:  Pain of left thumb -     meloxicam (MOBIC) 15 MG tablet; Take 1 tablet (15 mg total) by mouth daily. -     diclofenac Sodium (VOLTAREN) 1 % GEL; Apply 4 g topically 4 (four) times daily. To affected joint. -     DG Finger Thumb Left  Primary osteoarthritis of first carpometacarpal joint of left hand -     meloxicam (MOBIC) 15 MG tablet; Take 1 tablet (15 mg total) by mouth daily. -     diclofenac Sodium (VOLTAREN) 1 % GEL; Apply 4 g topically 4 (four) times daily. To affected joint.  suspected arthritis of CMC. Xray confirmed: Mild to moderate osteoarthritis of the left first CMC joint.  Start diclofenac gel, icing and over use limitation. mobic given as needed but for the next 5 days. Kidney function great. Watch for any GI upset and take with food. If not improving consider Dr. Darene Lamer for joint injection.    Vernetta Honey PA-C, have reviewed and agree with the above documentation in it's entirety.

## 2020-04-24 DIAGNOSIS — M1812 Unilateral primary osteoarthritis of first carpometacarpal joint, left hand: Secondary | ICD-10-CM | POA: Insufficient documentation

## 2020-04-24 NOTE — Progress Notes (Signed)
Madi,   As suspected arthritis in the joint. Treatment plan stays the same. If no improvement with gel, ice and rest then consider appt with Dr. Darene Lamer for injection.

## 2020-05-23 ENCOUNTER — Other Ambulatory Visit: Payer: Self-pay | Admitting: Physician Assistant

## 2020-05-23 ENCOUNTER — Encounter: Payer: Self-pay | Admitting: Physician Assistant

## 2020-05-31 ENCOUNTER — Other Ambulatory Visit: Payer: Self-pay | Admitting: Physician Assistant

## 2020-05-31 DIAGNOSIS — E781 Pure hyperglyceridemia: Secondary | ICD-10-CM

## 2020-05-31 DIAGNOSIS — E039 Hypothyroidism, unspecified: Secondary | ICD-10-CM

## 2020-05-31 DIAGNOSIS — E782 Mixed hyperlipidemia: Secondary | ICD-10-CM

## 2020-06-20 ENCOUNTER — Other Ambulatory Visit: Payer: Self-pay | Admitting: Physician Assistant

## 2020-06-21 ENCOUNTER — Encounter: Payer: Self-pay | Admitting: Physician Assistant

## 2020-06-21 DIAGNOSIS — E039 Hypothyroidism, unspecified: Secondary | ICD-10-CM

## 2020-06-21 MED ORDER — LEVOTHYROXINE SODIUM 75 MCG PO TABS: 75.0000 ug | ORAL_TABLET | Freq: Every day | ORAL | 0 refills | Status: DC

## 2020-06-24 ENCOUNTER — Other Ambulatory Visit: Payer: Self-pay | Admitting: Physician Assistant

## 2020-06-24 DIAGNOSIS — M1812 Unilateral primary osteoarthritis of first carpometacarpal joint, left hand: Secondary | ICD-10-CM

## 2020-06-24 DIAGNOSIS — M79645 Pain in left finger(s): Secondary | ICD-10-CM

## 2020-06-26 ENCOUNTER — Telehealth: Payer: Federal, State, Local not specified - PPO | Admitting: Family

## 2020-06-26 DIAGNOSIS — K120 Recurrent oral aphthae: Secondary | ICD-10-CM | POA: Diagnosis not present

## 2020-06-26 MED ORDER — TRIAMCINOLONE ACETONIDE 0.1 % MT PSTE: 1.0000 "application " | PASTE | Freq: Two times a day (BID) | OROMUCOSAL | 12 refills | Status: DC

## 2020-06-26 NOTE — Progress Notes (Signed)
E-Visit for Mouth Ulcers  We are sorry that you are not feeling well.  Here is how we plan to help!  Based on what you have shared with me, it appears that you do have mouth ulcer(s).     The following medications should decrease the discomfort and help with healing. Triamcinolone Dental Paste apply 3 times daily as needed for up to 3 days.   Mouth ulcers are painful areas in the mouth and gums. These are also known as "canker sores".  They can occur anywhere inside the mouth. While mostly harmless, mouth ulcers can be extremely uncomfortable and may make it difficult to eat, drink, and brush your teeth.  You may have more than 1 ulcer and they can vary and change in size. Mouth ulcers are not contagious and should not be confused with cold sores.  Cold sores appear on the lip or around the outside of the mouth and often begin with a tingling, burning or itching sensation.   While the exact causes are unknown, some common causes and factors that may aggravate mouth ulcers include: . Genetics - Sometimes mouth ulcers run in families . High alcohol intake . Acidic foods such as citrus fruits like pineapple, grapefruit, orange fruits/juices, may aggravate mouth ulcers . Other foods high in acidity or spice such as coffee, chocolate, chips, pretzels, eggs, nuts, cheese . Quitting smoking . Injury caused by biting the tongue or inside of the cheek . Diet lacking in W-41, zinc, folic acid or iron . Female hormone shifts with menstruation . Excessive fatigue, emotional stress or anxiety Prevention: . Talk to your doctor if you are taking meds that are known to cause mouth ulcers such as:   Anti-inflammatory drugs (for example Ibuprofen, Naproxen sodium), pain killers, Beta blockers, Oral nicotine replacement drugs, Some street drugs (heroin).   . Avoid allowing any tablets to dissolve in your mouth that are meant to swallowed whole . Avoid foods/drinks that trigger or worsen symptoms . Keep your  mouth clean with daily brushing and flossing  Home Care: . The goal with treatment is to ease the pain where ulcers occur and help them heal as quickly as possible.  There is no medical treatment to prevent mouth ulcers from coming back or recurring.  . Avoid spicy and acidic foods . Eat soft foods and avoid rough, crunchy foods . Avoid chewing gum . Do not use toothpaste that contains sodium lauryl sulphite . Use a straw to drink which helps avoid liquids toughing the ulcers near the front of your mouth . Use a very soft toothbrush . If you have dentures or dental hardware that you feel is not fitting well or contributing to his, please see your dentist. . Use saltwater mouthwash which helps healing. Dissolve a  teaspoon of salt in a glass of warm water. Swish around your mouth and spit it out. This can be used as needed if it is soothing.   GET HELP RIGHT AWAY IF: . Persistent ulcers require checking IN PERSON (face to face). Any mouth lesion lasting longer than a month should be seen by your DENTIST as soon as possible for evaluation for possible oral cancer. . If you have a non-painful ulcer in 1 or more areas of your mouth . Ulcers that are spreading, are very large or particularly painful . Ulcers last longer than one week without improving on treatment . If you develop a fever, swollen glands and begin to feel unwell . Ulcers that developed after  starting a new medication MAKE SURE YOU:  Understand these instructions.  Will watch your condition.  Will get help right away if you are not doing well or get worse.  Your e-visit answers were reviewed by a board certified advanced clinical practitioner to complete your personal care plan.  Depending upon the condition, your plan could have included both over the counter or prescription medications.    Please review your pharmacy choice.  Be sure that the pharmacy you have chosen is open so that you can pick up your prescription now.   If there is a problem, you can message your provider in [place] to have the prescription routed to another pharmacy.    Your safety is important to [place].  If you have drug allergies check our prescription carefully.  For the next 24 hours you can use MyChart to ask questions about today's visit, request a non-urgent call back, or ask for a work or school excuse from your e-visit provider.  You will get an email with a survey asking about your experience and to give [place] any feedback.  I hope that your e-visit has been valuable and will speed your recovery.   Approximately 5 minutes was spent documenting and reviewing patient's chart.

## 2020-07-09 ENCOUNTER — Other Ambulatory Visit: Payer: Self-pay | Admitting: Physician Assistant

## 2020-07-09 DIAGNOSIS — E039 Hypothyroidism, unspecified: Secondary | ICD-10-CM

## 2020-07-19 ENCOUNTER — Other Ambulatory Visit: Payer: Self-pay | Admitting: Physician Assistant

## 2020-07-23 ENCOUNTER — Ambulatory Visit (INDEPENDENT_AMBULATORY_CARE_PROVIDER_SITE_OTHER): Payer: Federal, State, Local not specified - PPO | Admitting: Physician Assistant

## 2020-07-23 VITALS — BP 138/79 | HR 89 | Ht 64.0 in | Wt 154.0 lb

## 2020-07-23 DIAGNOSIS — M1812 Unilateral primary osteoarthritis of first carpometacarpal joint, left hand: Secondary | ICD-10-CM | POA: Diagnosis not present

## 2020-07-23 DIAGNOSIS — M79644 Pain in right finger(s): Secondary | ICD-10-CM

## 2020-07-23 DIAGNOSIS — E039 Hypothyroidism, unspecified: Secondary | ICD-10-CM

## 2020-07-23 DIAGNOSIS — M79645 Pain in left finger(s): Secondary | ICD-10-CM

## 2020-07-23 NOTE — Patient Instructions (Addendum)
Can use mobic orally and/or diclofenac gel as needed.  Dr. Darene Lamer can do injections.  Ok to try CBT gummies or tumeric.  As needed thumb spica for joint rest.  Ice thumb on bad day.   Osteoarthritis  Osteoarthritis is a type of arthritis. It refers to joint pain or joint disease. Osteoarthritis affects tissue that covers the ends of bones in joints (cartilage). Cartilage acts as a cushion between the bones and helps them move smoothly. Osteoarthritis occurs when cartilage in the joints gets worn down. Osteoarthritis is sometimes called "wear and tear" arthritis. Osteoarthritis is the most common form of arthritis. It often occurs in older people. It is a condition that gets worse over time. The joints most often affected by this condition are in the fingers, toes, hips, knees, and spine, including the neck and lower back. What are the causes? This condition is caused by the wearing down of cartilage that covers the ends of bones. What increases the risk? The following factors may make you more likely to develop this condition:  Being age 88 or older.  Obesity.  Overuse of joints.  Past injury of a joint.  Past surgery on a joint.  Family history of osteoarthritis. What are the signs or symptoms? The main symptoms of this condition are pain, swelling, and stiffness in the joint. Other symptoms may include:  An enlarged joint.  More pain and further damage caused by small pieces of bone or cartilage that break off and float inside of the joint.  Small deposits of bone (osteophytes) that grow on the edges of the joint.  A grating or scraping feeling inside the joint when you move it.  Popping or creaking sounds when you move.  Difficulty walking or exercising.  An inability to grip items, twist your hand(s), or control the movements of your hands and fingers. How is this diagnosed? This condition may be diagnosed based on:  Your medical history.  A physical exam.  Your  symptoms.  X-rays of the affected joint(s).  Blood tests to rule out other types of arthritis. How is this treated? There is no cure for this condition, but treatment can help control pain and improve joint function. Treatment may include a combination of therapies, such as:  Pain relief techniques, such as: ? Applying heat and cold to the joint. ? Massage. ? A form of talk therapy called cognitive behavioral therapy (CBT). This therapy helps you set goals and follow up on the changes that you make.  Medicines for pain and inflammation. The medicines can be taken by mouth or applied to the skin. They include: ? NSAIDs, such as ibuprofen. ? Prescription medicines. ? Strong anti-inflammatory medicines (corticosteroids). ? Certain nutritional supplements.  A prescribed exercise program. You may work with a physical therapist.  Assistive devices, such as a brace, wrap, splint, specialized glove, or cane.  A weight control plan.  Surgery, such as: ? An osteotomy. This is done to reposition the bones and relieve pain or to remove loose pieces of bone and cartilage. ? Joint replacement surgery. You may need this surgery if you have advanced osteoarthritis. Follow these instructions at home: Activity  Rest your affected joints as told by your health care provider.  Exercise as told by your health care provider. He or she may recommend specific types of exercise, such as: ? Strengthening exercises. These are done to strengthen the muscles that support joints affected by arthritis. ? Aerobic activities. These are exercises, such as brisk walking  or water aerobics, that increase your heart rate. ? Range-of-motion activities. These help your joints move more easily. ? Balance and agility exercises. Managing pain, stiffness, and swelling  If directed, apply heat to the affected area as often as told by your health care provider. Use the heat source that your health care provider  recommends, such as a moist heat pack or a heating pad. ? If you have a removable assistive device, remove it as told by your health care provider. ? Place a towel between your skin and the heat source. If your health care provider tells you to keep the assistive device on while you apply heat, place a towel between the assistive device and the heat source. ? Leave the heat on for 20-30 minutes. ? Remove the heat if your skin turns bright red. This is especially important if you are unable to feel pain, heat, or cold. You may have a greater risk of getting burned.  If directed, put ice on the affected area. To do this: ? If you have a removable assistive device, remove it as told by your health care provider. ? Put ice in a plastic bag. ? Place a towel between your skin and the bag. If your health care provider tells you to keep the assistive device on during icing, place a towel between the assistive device and the bag. ? Leave the ice on for 20 minutes, 2-3 times a day. ? Move your fingers or toes often to reduce stiffness and swelling. ? Raise (elevate) the injured area above the level of your heart while you are sitting or lying down.      General instructions  Take over-the-counter and prescription medicines only as told by your health care provider.  Maintain a healthy weight. Follow instructions from your health care provider for weight control.  Do not use any products that contain nicotine or tobacco, such as cigarettes, e-cigarettes, and chewing tobacco. If you need help quitting, ask your health care provider.  Use assistive devices as told by your health care provider.  Keep all follow-up visits as told by your health care provider. This is important. Where to find more information  [employer] of Arthritis and Musculoskeletal and Skin Diseases: www.niams.SouthExposed.es  [employer] on Aging: http://kim-miller.com/  American College of Rheumatology:  www.rheumatology.org Contact a health care provider if:  You have redness, swelling, or a feeling of warmth in a joint that gets worse.  You have a fever along with joint or muscle aches.  You develop a rash.  You have trouble doing your normal activities. Get help right away if:  You have pain that gets worse and is not relieved by pain medicine. Summary  Osteoarthritis is a type of arthritis that affects tissue covering the ends of bones in joints (cartilage).  This condition is caused by the wearing down of cartilage that covers the ends of bones.  The main symptom of this condition is pain, swelling, and stiffness in the joint.  There is no cure for this condition, but treatment can help control pain and improve joint function. This information is not intended to replace advice given to you by your health care provider. Make sure you discuss any questions you have with your health care provider. Document Revised: 05/15/2019 Document Reviewed: 05/15/2019 Elsevier Patient Education  2021 [employer].

## 2020-07-24 ENCOUNTER — Encounter: Payer: Self-pay | Admitting: Physician Assistant

## 2020-07-24 DIAGNOSIS — M79644 Pain in right finger(s): Secondary | ICD-10-CM | POA: Insufficient documentation

## 2020-07-24 LAB — TSH: TSH: 0.57 mIU/L (ref 0.40–4.50)

## 2020-07-24 NOTE — Progress Notes (Signed)
Your TSH is in normal range and more on the HYPER side of normal. If you are feeling fine with energy, anxiety, no palpitations, not jittery we can refill at same dose. If feeling more hyper symptoms we could decrease dosing just a bit. How are you feeling?

## 2020-07-24 NOTE — Progress Notes (Signed)
   Subjective:    Patient ID: Gabriela Baker, female    DOB: Oct 12, 1945, 75 y.o.   MRN: [id number]  HPI  Patient is a 75 year old female with hypothyroidism, osteopenia, COPD, atherosclerosis, known osteoarthritis who presents to the clinic with bilateral thumb pain.  She previously was only having pain in her left CMC joint.  We confirmed osteoarthritis with x-ray.  She is now having the same pain in her right [facility] joint.  It is very achy and at times feels like it gets locked up. No injury. She uses diclofenac and helps some but "sticky". Has not been taking mobic. Left CMC joint is better.   Needs thyroid medication refilled.    .. Active Ambulatory Problems    Diagnosis Date Noted  . History of anaphylaxis 04/15/2015  . Thyroid activity decreased 04/15/2015  . Tobacco dependence 04/15/2015  . Hyperlipidemia 04/15/2015  . Osteopenia 04/15/2015  . Post-menopausal 04/15/2015  . 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 Hypertriglyceridemia 10/08/2015  . Tobacco use disorder 06/25/2016  . Chronic obstructive pulmonary disease ([place]) 06/25/2016  . History of smoking greater than 50 pack years 06/25/2016  . Aortic atherosclerosis ([place]) 06/25/2016  . Former smoker 07/18/2018  . Tachycardia 07/18/2018  . Elevated liver enzymes 11/22/2019  . Combined forms of age-related cataract of left eye 06/20/2019  . 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 Primary osteoarthritis of first carpometacarpal joint of left hand 04/24/2020   Resolved Ambulatory Problems    Diagnosis Date Noted  . No Resolved Ambulatory Problems   Past Medical History:  Diagnosis Date  . Thyroid disease      Review of Systems See HPI.     Objective:   Physical Exam Vitals reviewed.  Musculoskeletal:     Cervical back: Normal range of motion and neck supple. No tenderness.     Comments: Bilateral tenderness to palpation over [facility] joint bilateral hands.  No erythema or warmth or swelling.  Right thumb 3/5  thumb strength.  Left thumb 4/5 thumb strength.  Hand grip 5/5, bilaterally.  Negative Finkelstein, bilaterally.   Neurological:     Mental Status: She is alert.           Assessment & Plan:  Marland KitchenMarland KitchenLevetta was seen today for hand problem.  Diagnoses and all orders for this visit:  Bilateral thumb pain  Primary osteoarthritis of first carpometacarpal joint of left hand  Hypothyroidism, unspecified type -     TSH   Xray confirmed left CMC arthritis. No xray of right CMC joint but symptoms the same. No injury.  Discussed OA. Discussed thumb spica for rest as needed.  mobic regularly. Kidney function reviewed and stable.  Diclofenac gel as needed.  Ice regularly.  Consider tumeric or CBT gummies if help.  Dr. Darene Lamer can do injections in joints that are painful as well.   Needs TSH recheck to adjust medications. No concerns.

## 2020-07-31 ENCOUNTER — Other Ambulatory Visit: Payer: Self-pay | Admitting: Physician Assistant

## 2020-11-14 ENCOUNTER — Other Ambulatory Visit: Payer: Self-pay | Admitting: Family Medicine

## 2020-11-14 ENCOUNTER — Other Ambulatory Visit: Payer: Self-pay | Admitting: Neurology

## 2020-11-14 DIAGNOSIS — Z87892 Personal history of anaphylaxis: Secondary | ICD-10-CM

## 2020-11-14 DIAGNOSIS — Z87891 Personal history of nicotine dependence: Secondary | ICD-10-CM

## 2020-11-14 DIAGNOSIS — J449 Chronic obstructive pulmonary disease, unspecified: Secondary | ICD-10-CM

## 2020-11-14 MED ORDER — ANORO ELLIPTA 62.5-25 MCG/INH IN AEPB: 1.0000 | INHALATION_SPRAY | Freq: Every day | RESPIRATORY_TRACT | 2 refills | Status: DC

## 2020-12-10 ENCOUNTER — Other Ambulatory Visit: Payer: Self-pay

## 2020-12-10 ENCOUNTER — Encounter: Payer: Self-pay | Admitting: Physician Assistant

## 2020-12-10 ENCOUNTER — Ambulatory Visit (INDEPENDENT_AMBULATORY_CARE_PROVIDER_SITE_OTHER): Payer: Federal, State, Local not specified - PPO | Admitting: Physician Assistant

## 2020-12-10 VITALS — BP 145/66 | HR 105 | Temp 98.5°F | Ht 64.0 in | Wt 151.0 lb

## 2020-12-10 DIAGNOSIS — J329 Chronic sinusitis, unspecified: Secondary | ICD-10-CM

## 2020-12-10 DIAGNOSIS — J441 Chronic obstructive pulmonary disease with (acute) exacerbation: Secondary | ICD-10-CM | POA: Diagnosis not present

## 2020-12-10 DIAGNOSIS — J40 Bronchitis, not specified as acute or chronic: Secondary | ICD-10-CM | POA: Diagnosis not present

## 2020-12-10 MED ORDER — AZITHROMYCIN 250 MG PO TABS: ORAL_TABLET | ORAL | 0 refills | Status: DC

## 2020-12-10 MED ORDER — PREDNISONE 50 MG PO TABS: ORAL_TABLET | ORAL | 0 refills | Status: DC

## 2020-12-10 NOTE — Patient Instructions (Signed)

## 2020-12-10 NOTE — Progress Notes (Signed)
Subjective:    Patient ID: Gabriela Baker, female    DOB: Oct 28, 1945, 75 y.o.   MRN: [id number]  HPI Patient is a 75 year old female with aortic atherosclerosis, COPD, former smoker who presents to the clinic with sinus pressure, ear popping, headache, sore throat, cough, shortness of breath that is worsening.  She has had symptoms for over a week now.  She states it feels like she is smoking 2 packs of cigarettes a day but she stopped a year ago.  She denies any fever, chills, body aches.  She is using her anoro daily and having to use albuterol more times a day. She is taking mucinex and tylenol for symptoms with little benefits. She feels like "her chest is getting tighter and tighter".   Home tested negative for covid. No sick contacts.   .. Active Ambulatory Problems    Diagnosis Date Noted   History of anaphylaxis 04/15/2015   Thyroid activity decreased 04/15/2015   Tobacco dependence 04/15/2015   Hyperlipidemia 04/15/2015   Osteopenia 04/15/2015   Post-menopausal 04/15/2015   Atypical nevus 10/04/2015   Hypertriglyceridemia 10/08/2015   Tobacco use disorder 06/25/2016   Chronic obstructive pulmonary disease ([place]) 06/25/2016   History of smoking greater than 50 pack years 06/25/2016   Aortic atherosclerosis ([place]) 06/25/2016   Former smoker 07/18/2018   Tachycardia 07/18/2018   Elevated liver enzymes 11/22/2019   Combined forms of age-related cataract of left eye 06/20/2019   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     Objective:   Physical Exam Vitals reviewed.  Constitutional:      Appearance: She is well-developed.  HENT:     Head: Normocephalic.      Right Ear: Tympanic membrane normal. No middle ear effusion. Tympanic membrane is not erythematous.     Left Ear: Tympanic membrane normal.  No middle ear effusion. Tympanic membrane is not erythematous.     Nose: Congestion present.     Mouth/Throat:     Mouth: Mucous membranes are moist.     Pharynx: Uvula midline. Posterior oropharyngeal erythema present. No oropharyngeal exudate.     Comments: Poor dentition with many missing teeth.  Eyes:     Conjunctiva/sclera: Conjunctivae normal.  Cardiovascular:     Rate and Rhythm: Regular rhythm. Tachycardia present.  Pulmonary:     Effort: Pulmonary effort is normal.     Breath sounds: Normal breath sounds. No wheezing or rhonchi.     Comments: Productive cough with deep breathing.  Musculoskeletal:     Cervical back: Normal range of motion and neck supple.  Lymphadenopathy:     Cervical: No cervical adenopathy.  Neurological:     General: No focal deficit present.     Mental Status: She is alert.  Psychiatric:        Mood and Affect: Mood normal.          Assessment & Plan:  Marland KitchenMarland KitchenZelma was seen today for cough and sore throat.  Diagnoses and all orders for this visit:  Sinobronchitis -     azithromycin (ZITHROMAX Z-PAK) 250 MG tablet; Take 2 tablets (500 mg) on  Day 1,  followed by 1 tablet (250  mg) once daily on Days 2 through 5. -     predniSONE (DELTASONE) 50 MG tablet; One tab PO daily for 5 days.  COPD exacerbation (HCC) -     azithromycin (ZITHROMAX Z-PAK) 250 MG tablet; Take 2 tablets (500 mg) on  Day 1,  followed by 1 tablet (250 mg) once daily on Days 2 through 5. -     predniSONE (DELTASONE) 50 MG tablet; One tab PO daily for 5 days.  Covid vaccinated x3.  Negative home covid testing. Likely another virus.  Treated for sinobronchitis/COPD exacerbation due to over a week of symptoms with zpak and prednisone. Continue to use albuterol as needed every 4-6 hours. Stay on anoro.  Take good deep breaths and stay hydrated.

## 2021-01-25 ENCOUNTER — Emergency Department
Admission: RE | Admit: 2021-01-25 | Discharge: 2021-01-25 | Disposition: A | Discharge status: Home / Self Care | Payer: Federal, State, Local not specified - PPO | Source: Ambulatory Visit

## 2021-01-25 ENCOUNTER — Telehealth: Payer: Self-pay | Admitting: Emergency Medicine

## 2021-01-25 ENCOUNTER — Other Ambulatory Visit: Payer: Self-pay

## 2021-01-25 VITALS — BP 115/79 | HR 97 | Temp 98.8°F | Resp 16

## 2021-01-25 DIAGNOSIS — L089 Local infection of the skin and subcutaneous tissue, unspecified: Secondary | ICD-10-CM

## 2021-01-25 DIAGNOSIS — S60562A Insect bite (nonvenomous) of left hand, initial encounter: Secondary | ICD-10-CM

## 2021-01-25 DIAGNOSIS — W57XXXA Bitten or stung by nonvenomous insect and other nonvenomous arthropods, initial encounter: Secondary | ICD-10-CM | POA: Diagnosis not present

## 2021-01-25 MED ORDER — METHYLPREDNISOLONE ACETATE 80 MG/ML IJ SUSP
80.0000 mg | Freq: Once | INTRAMUSCULAR | Status: AC
  Administered 2021-01-25: 80 mg via INTRAMUSCULAR

## 2021-01-25 NOTE — ED Provider Notes (Signed)
[name]    CSN: [id number] Arrival date & time: 01/25/21  1358      History   Chief Complaint Chief Complaint  Patient presents with   Insect Bite    HPI Gabriela Baker is a 75 y.o. female.   HPI 75 year old female presents with insect bite of left hand between fourth and fifth fingers.  Patient reports noticing insect bite of left hand on Monday, 01/20/2021.  Past Medical History:  Diagnosis Date   Hyperlipidemia    Thyroid disease     Patient Active Problem List   Diagnosis Date Noted   Bilateral thumb pain 07/24/2020   Primary osteoarthritis of first carpometacarpal joint of left hand 04/24/2020   Elevated liver enzymes 11/22/2019   Combined forms of age-related cataract of left eye 06/20/2019   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   Former smoker 07/18/2018   Tachycardia 07/18/2018   Tobacco use disorder 06/25/2016   Chronic obstructive pulmonary disease ([place]) 06/25/2016   History of smoking greater than 50 pack years 06/25/2016   Aortic atherosclerosis ([place]) 06/25/2016   Hypertriglyceridemia 10/08/2015   Atypical nevus 10/04/2015   History of anaphylaxis 04/15/2015   Thyroid activity decreased 04/15/2015   Tobacco dependence 04/15/2015   Hyperlipidemia 04/15/2015   Osteopenia 04/15/2015   Post-menopausal 04/15/2015    History reviewed. No pertinent surgical history.  OB History   No obstetric history on file.      Home Medications    Prior to Admission medications   Medication Sig Start Date End Date Taking? Authorizing Provider  albuterol (VENTOLIN HFA) 108 (90 Base) MCG/ACT inhaler TAKE 2 PUFFS BY MOUTH EVERY 6 HOURS AS NEEDED FOR WHEEZE OR SHORTNESS OF BREATH 09/11/19   Breeback, Jade L, PA-C  aspirin EC 81 MG tablet Take 81 mg by mouth daily.    [provider]  atorvastatin (LIPITOR) 40 MG tablet Take 1 tablet (40 mg total) by mouth daily. 04/03/20   Breeback, Jade L, PA-C  azithromycin (ZITHROMAX  Z-PAK) 250 MG tablet Take 2 tablets (500 mg) on  Day 1,  followed by 1 tablet (250 mg) once daily on Days 2 through 5. 12/10/20   Breeback, Jade L, PA-C  Calcium Citrate-Vitamin D (CALCIUM + D PO) Take by mouth daily.    [provider]  diclofenac Sodium (VOLTAREN) 1 % GEL Apply 4 g topically 4 (four) times daily. To affected joint. 04/23/20   Breeback, Royetta Car, PA-C  levothyroxine (SYNTHROID) 75 MCG tablet TAKE 1 TABLET BY MOUTH EVERY DAY 07/31/20   Breeback, Jade L, PA-C  meloxicam (MOBIC) 15 MG tablet TAKE 1 TABLET BY MOUTH EVERY DAY 06/24/20   Breeback, Jade L, PA-C  Multiple Vitamins-Minerals (CENTRUM SILVER PO) Take by mouth daily.    [provider]  predniSONE (DELTASONE) 50 MG tablet One tab PO daily for 5 days. 12/10/20   Breeback, Jade L, PA-C  triamcinolone (KENALOG) 0.1 % paste Use as directed 1 application in the mouth or throat 2 (two) times daily. 06/26/20   Evelina Dun A, FNP  triamcinolone cream (KENALOG) 0.1 % Apply 1 application topically 2 (two) times daily. 02/13/20   Kandra Nicolas, MD  umeclidinium-vilanterol (ANORO ELLIPTA) 62.5-25 MCG/INH AEPB Inhale 1 puff into the lungs daily. 11/14/20   Donella Stade, PA-C    Family History Family History  Problem Relation Age of Onset   Cancer Mother    Heart failure Father    Cancer Father     Social  History Social History   Tobacco Use   Smoking status: Former    Packs/day: 1.00    Years: 54.00    Pack years: 54.00    Types: Cigarettes    Quit date: 09/02/2019    Years since quitting: 1.4   Smokeless tobacco: Never  Substance Use Topics   Alcohol use: Yes    Alcohol/week: 3.0 standard drinks    Types: 3 Glasses of wine per week    Comment: 21 glasses a week   Drug use: No     Allergies   Bee venom   Review of Systems Review of Systems  Skin:  Positive for rash.    Physical Exam Triage Vital Signs ED Triage Vitals  Enc Vitals Group     BP 01/25/21 1416 115/79     Pulse Rate 01/25/21  1416 97     Resp 01/25/21 1416 16     Temp 01/25/21 1416 98.8 F (37.1 C)     Temp Source 01/25/21 1416 Oral     SpO2 01/25/21 1416 96 %     Weight --      Height --      Head Circumference --      Peak Flow --      Pain Score 01/25/21 1415 0     Pain Loc --      Pain Edu? --      Excl. in [place]? --    No data found.  Updated Vital Signs BP 115/79 (BP Location: Left Arm)   Pulse 97   Temp 98.8 F (37.1 C) (Oral)   Resp 16   SpO2 96%      Physical Exam Vitals reviewed.  Constitutional:      General: She is not in acute distress.    Appearance: Normal appearance. She is normal weight. She is not ill-appearing.  HENT:     Head: Normocephalic and atraumatic.     Mouth/Throat:     Mouth: Mucous membranes are moist.     Pharynx: Oropharynx is clear.  Eyes:     Extraocular Movements: Extraocular movements intact.     Conjunctiva/sclera: Conjunctivae normal.     Pupils: Pupils are equal, round, and reactive to light.  Cardiovascular:     Rate and Rhythm: Normal rate and regular rhythm.     Pulses: Normal pulses.     Heart sounds: Normal heart sounds. No murmur heard.   No friction rub. No gallop.  Pulmonary:     Effort: Pulmonary effort is normal.     Breath sounds: Normal breath sounds. No wheezing, rhonchi or rales.  Musculoskeletal:        General: Normal range of motion.     Cervical back: Normal range of motion and neck supple. No tenderness.  Lymphadenopathy:     Cervical: No cervical adenopathy.  Skin:    General: Skin is warm and dry.     Comments: Left hand (lateral aspect of ring finger): ~3-4 mm circular shaped erythematous maculopapular lesion, pruritic in nature, mildly indurated, fluctuant  Neurological:     General: No focal deficit present.     Mental Status: She is alert and oriented to person, place, and time. Mental status is at baseline.  Psychiatric:        Mood and Affect: Mood normal.        Behavior: Behavior normal.        Thought Content:  Thought content normal.     UC Treatments / Results  Labs (all labs ordered are listed, but only abnormal results are displayed) Labs Reviewed - No data to display  EKG   Radiology No results found.  Procedures Procedures (including critical care time)  Medications Ordered in UC Medications  methylPREDNISolone acetate (DEPO-MEDROL) injection 80 mg (80 mg Intramuscular Given 01/25/21 1508)    Initial Impression / Assessment and Plan / UC Course  I have reviewed the triage vital signs and the nursing notes.  Pertinent labs & imaging results that were available during my care of the patient were reviewed by me and considered in my medical decision making (see chart for details).     MDM: 1.  Infected insect bite of finger, initial encounter-IM Depo-Medrol 80 mg given once in clinic prior to discharge today, Rx'd Doxycycline. Advised patient to take medication as directed with food to completion.  Encouraged patient to increase daily water intake while taking this medication.  Patient discharged home, hemodynamically stable. Final Clinical Impressions(s) / UC Diagnoses   Final diagnoses:  Infected insect bite of finger, initial encounter     Discharge Instructions      Advised patient to take medication as directed with food to completion.  Encouraged patient to increase daily water intake while taking this medication.     ED Prescriptions   None    PDMP not reviewed this encounter.   Eliezer Lofts, [place] 01/25/21 [phone]

## 2021-01-25 NOTE — ED Triage Notes (Signed)
Pt present insect bite to the left hand between her pinky and ring finger. Pt states the area is itching and burning. Pt noticed the insect bite on Monday

## 2021-01-25 NOTE — Discharge Instructions (Addendum)
Advised patient to take medication as directed with food to completion.  Encouraged patient to increase daily water intake while taking this medication. 

## 2021-01-25 NOTE — Telephone Encounter (Signed)
Call back to  pharmacy regarding  missing prescription - per provider here today (M.Ragan, FNP) doxycycline should have been escribed - verbal orders given to pharmacist [facility] for doxycycline 100 mg po  BID x 7 days. Call to [facility] at 1640 to apologize for the missing script, RN confirmed that she can return to pharmacy to pick up antibiotic so she can start today. Pt verbalized an understanding.

## 2021-03-21 ENCOUNTER — Other Ambulatory Visit: Payer: Self-pay | Admitting: Physician Assistant

## 2021-03-24 ENCOUNTER — Ambulatory Visit: Payer: Federal, State, Local not specified - PPO | Admitting: Physician Assistant

## 2021-03-31 ENCOUNTER — Ambulatory Visit (INDEPENDENT_AMBULATORY_CARE_PROVIDER_SITE_OTHER): Payer: Federal, State, Local not specified - PPO | Admitting: Physician Assistant

## 2021-03-31 VITALS — BP 139/71 | HR 81 | Ht 64.0 in | Wt 151.0 lb

## 2021-03-31 DIAGNOSIS — E782 Mixed hyperlipidemia: Secondary | ICD-10-CM

## 2021-03-31 DIAGNOSIS — Z0000 Encounter for general adult medical examination without abnormal findings: Secondary | ICD-10-CM | POA: Diagnosis not present

## 2021-03-31 DIAGNOSIS — J449 Chronic obstructive pulmonary disease, unspecified: Secondary | ICD-10-CM

## 2021-03-31 DIAGNOSIS — Z23 Encounter for immunization: Secondary | ICD-10-CM | POA: Diagnosis not present

## 2021-03-31 DIAGNOSIS — E039 Hypothyroidism, unspecified: Secondary | ICD-10-CM

## 2021-03-31 DIAGNOSIS — Z13820 Encounter for screening for osteoporosis: Secondary | ICD-10-CM

## 2021-03-31 DIAGNOSIS — Z131 Encounter for screening for diabetes mellitus: Secondary | ICD-10-CM

## 2021-03-31 DIAGNOSIS — R748 Abnormal levels of other serum enzymes: Secondary | ICD-10-CM

## 2021-03-31 MED ORDER — UMECLIDINIUM-VILANTEROL 62.5-25 MCG/ACT IN AEPB: 1.0000 | INHALATION_SPRAY | Freq: Every day | RESPIRATORY_TRACT | 11 refills | Status: DC

## 2021-03-31 NOTE — Progress Notes (Signed)
Subjective:     Gabriela Baker is a 75 y.o. female and is here for a comprehensive physical exam. The patient reports no problems.  Social History   Socioeconomic History   Marital status: Divorced    Spouse name: Not on file   Number of children: Not on file   Years of education: Not on file   Highest education level: Not on file  Occupational History   Not on file  Tobacco Use   Smoking status: Former    Packs/day: 1.00    Years: 54.00    Pack years: 54.00    Types: Cigarettes    Quit date: 09/02/2019    Years since quitting: 1.5   Smokeless tobacco: Never  Substance and Sexual Activity   Alcohol use: Yes    Alcohol/week: 3.0 standard drinks    Types: 3 Glasses of wine per week    Comment: 21 glasses a week   Drug use: No   Sexual activity: Not Currently  Other Topics Concern   Not on file  Social History Narrative   Not on file   Social Determinants of Health   Financial Resource Strain: Not on file  Food Insecurity: Not on file  Transportation Needs: Not on file  Physical Activity: Not on file  Stress: Not on file  Social Connections: Not on file  Intimate Partner Violence: Not on file   Health Maintenance  Topic Date Due   COVID-19 Vaccine (4 - Booster for Moderna series) 05/16/2021   Fecal DNA (Cologuard)  04/10/2023   TETANUS/TDAP  06/13/2023   Pneumonia Vaccine 25+ Years old  Completed   INFLUENZA VACCINE  Completed   DEXA SCAN  Completed   Hepatitis C Screening  Completed   Zoster Vaccines- Shingrix  Completed   HPV VACCINES  Aged Out    The following portions of the patient's history were reviewed and updated as appropriate: allergies, current medications, past family history, past medical history, past social history, past surgical history, and problem list.  Review of Systems A comprehensive review of systems was negative.   Objective:    BP 139/71   Pulse 81   Ht 5\' 4"  (1.626 m)   Wt 151 lb (68.5 kg)   SpO2 99%   BMI 25.92 kg/m   General appearance: alert, cooperative, and appears stated age Head: Normocephalic, without obvious abnormality, atraumatic Neck: no adenopathy, no carotid bruit, no JVD, supple, symmetrical, trachea midline, and thyroid not enlarged, symmetric, no tenderness/mass/nodules Back: symmetric, no curvature. ROM normal. No CVA tenderness. Lungs: clear to auscultation bilaterally Heart: regular rate and rhythm, S1, S2 normal, no murmur, click, rub or gallop Abdomen: soft, non-tender; bowel sounds normal; no masses,  no organomegaly Extremities: extremities normal, atraumatic, no cyanosis or edema Pulses: 2+ and symmetric Skin: Skin color, texture, turgor normal. No rashes or lesions Neurologic: Alert and oriented X 3, normal strength and tone. Normal symmetric reflexes. Normal coordination and gait   .[name] Depression screen [facility] 2/9 03/31/2021 03/29/2020 03/20/2019 02/28/2018 03/05/2017  Decreased Interest 0 0 0 0 0  Down, Depressed, Hopeless 0 0 0 0 0  PHQ - 2 Score 0 0 0 0 0  Altered sleeping 0 - 0 0 -  Tired, decreased energy 0 - 0 0 -  Change in appetite 0 - 0 0 -  Feeling bad or failure about yourself  0 - 0 0 -  Trouble concentrating 0 - 0 0 -  Moving slowly or fidgety/restless 0 - 0 0 -  Suicidal thoughts 0 - 0 0 -  PHQ-9 Score 0 - 0 0 -  Difficult doing work/chores Not difficult at all - Not difficult at all Not difficult at all -    Assessment:    Healthy female exam.      Plan:    Marland KitchenMarland KitchenMarciel was seen today for annual exam.  Diagnoses and all orders for this visit:  Annual physical exam -     TSH -     Lipid Panel w/reflex Direct LDL -     COMPLETE METABOLIC PANEL WITH GFR -     CBC with Differential/Platelet  Hypothyroidism, unspecified type -     TSH  Need for influenza vaccination  Mixed hyperlipidemia -     Lipid Panel w/reflex Direct LDL  Screening for diabetes mellitus -     COMPLETE METABOLIC PANEL WITH GFR  Elevated liver enzymes -     COMPLETE METABOLIC  PANEL WITH GFR  Osteoporosis screening -     DG Bone Density; Future  Chronic obstructive pulmonary disease, unspecified COPD type ([place]) -     umeclidinium-vilanterol (ANORO ELLIPTA) 62.5-25 MCG/ACT AEPB; Inhale 1 puff into the lungs daily at 6 (six) AM.  .. Discussed 150 minutes of exercise a week.  Encouraged vitamin D 1000 units and Calcium 1300mg  or 4 servings of dairy a day.  PHQ no concerns.  Fasting labs ordered.  No indication for pap.  Declined mammogram.  Ordered bone density.  Shingles/Flu/Tdap/pneumonia/Covid x4 are up to date.   COPD- anoro refilled.    See After Visit Summary for Counseling Recommendations

## 2021-03-31 NOTE — Patient Instructions (Signed)
Health Maintenance After Age 76 After age 76, you are at a higher risk for certain long-term diseases and infections as well as injuries from falls. Falls are a major cause of broken bones and head injuries in people who are older than age 76. Getting regular preventive care can help to keep you healthy and well. Preventive care includes getting regular testing and making lifestyle changes as recommended by your health care provider. Talk with your health care provider about: Which screenings and tests you should have. A screening is a test that checks for a disease when you have no symptoms. A diet and exercise plan that is right for you. What should I know about screenings and tests to prevent falls? Screening and testing are the best ways to find a health problem early. Early diagnosis and treatment give you the best chance of managing medical conditions that are common after age 76. Certain conditions and lifestyle choices may make you more likely to have a fall. Your health care provider may recommend: Regular vision checks. Poor vision and conditions such as cataracts can make you more likely to have a fall. If you wear glasses, make sure to get your prescription updated if your vision changes. Medicine review. Work with your health care provider to regularly review all of the medicines you are taking, including over-the-counter medicines. Ask your health care provider about any side effects that may make you more likely to have a fall. Tell your health care provider if any medicines that you take make you feel dizzy or sleepy. Osteoporosis screening. Osteoporosis is a condition that causes the bones to get weaker. This can make the bones weak and cause them to break more easily. Blood pressure screening. Blood pressure changes and medicines to control blood pressure can make you feel dizzy. Strength and balance checks. Your health care provider may recommend certain tests to check your strength and  balance while standing, walking, or changing positions. Foot health exam. Foot pain and numbness, as well as not wearing proper footwear, can make you more likely to have a fall. Depression screening. You may be more likely to have a fall if you have a fear of falling, feel emotionally low, or feel unable to do activities that you used to do. Alcohol use screening. Using too much alcohol can affect your balance and may make you more likely to have a fall. What actions can I take to lower my risk of falls? General instructions Talk with your health care provider about your risks for falling. Tell your health care provider if: You fall. Be sure to tell your health care provider about all falls, even ones that seem minor. You feel dizzy, sleepy, or off-balance. Take over-the-counter and prescription medicines only as told by your health care provider. These include any supplements. Eat a healthy diet and maintain a healthy weight. A healthy diet includes low-fat dairy products, low-fat (lean) meats, and fiber from whole grains, beans, and lots of fruits and vegetables. Home safety Remove any tripping hazards, such as rugs, cords, and clutter. Install safety equipment such as grab bars in bathrooms and safety rails on stairs. Keep rooms and walkways well-lit. Activity  Follow a regular exercise program to stay fit. This will help you maintain your balance. Ask your health care provider what types of exercise are appropriate for you. If you need a cane or walker, use it as recommended by your health care provider. Wear supportive shoes that have nonskid soles. Lifestyle Do not   drink alcohol if your health care provider tells you not to drink. If you drink alcohol, limit how much you have: 0-1 drink a day for women. 0-2 drinks a day for men. Be aware of how much alcohol is in your drink. In the [place] one drink equals one typical bottle of beer (12 oz), one-half glass of wine (5 oz), or one shot of  hard liquor (1 oz). Do not use any products that contain nicotine or tobacco, such as cigarettes and e-cigarettes. If you need help quitting, ask your health care provider. Summary Having a healthy lifestyle and getting preventive care can help to protect your health and wellness after age 76. Screening and testing are the best way to find a health problem early and help you avoid having a fall. Early diagnosis and treatment give you the best chance for managing medical conditions that are more common for people who are older than age 76. Falls are a major cause of broken bones and head injuries in people who are older than age 76. Take precautions to prevent a fall at home. Work with your health care provider to learn what changes you can make to improve your health and wellness and to prevent falls. This information is not intended to replace advice given to you by your health care provider. Make sure you discuss any questions you have with your health care provider. Document Revised: 07/26/2020 Document Reviewed: 05/03/2020 Elsevier Patient Education  2022 [employer]vier Inc.  

## 2021-04-01 ENCOUNTER — Other Ambulatory Visit: Payer: Self-pay | Admitting: Physician Assistant

## 2021-04-01 ENCOUNTER — Encounter: Payer: Self-pay | Admitting: Physician Assistant

## 2021-04-01 LAB — COMPLETE METABOLIC PANEL WITH GFR
AG Ratio: 1.6 (calc) (ref 1.0–2.5)
ALT: 34 U/L — ABNORMAL HIGH (ref 6–29)
AST: 29 U/L (ref 10–35)
Albumin: 4.6 g/dL (ref 3.6–5.1)
Alkaline phosphatase (APISO): 94 U/L (ref 37–153)
BUN: 10 mg/dL (ref 7–25)
CO2: 29 mmol/L (ref 20–32)
Calcium: 10 mg/dL (ref 8.6–10.4)
Chloride: 103 mmol/L (ref 98–110)
Creat: 0.71 mg/dL (ref 0.60–1.00)
Globulin: 2.9 g/dL (calc) (ref 1.9–3.7)
Glucose, Bld: 95 mg/dL (ref 65–99)
Potassium: 4.5 mmol/L (ref 3.5–5.3)
Sodium: 141 mmol/L (ref 135–146)
Total Bilirubin: 0.5 mg/dL (ref 0.2–1.2)
Total Protein: 7.5 g/dL (ref 6.1–8.1)
eGFR: 89 mL/min/{1.73_m2} (ref >=60)

## 2021-04-01 LAB — CBC WITH DIFFERENTIAL/PLATELET
Absolute Monocytes: 587 cells/uL (ref 200–950)
Basophils Absolute: 62 cells/uL (ref 0–200)
Basophils Relative: 0.9 %
Eosinophils Absolute: 221 cells/uL (ref 15–500)
Eosinophils Relative: 3.2 %
HCT: 39.8 % (ref 35.0–45.0)
Hemoglobin: 13.2 g/dL (ref 11.7–15.5)
Lymphs Abs: 2415 cells/uL (ref 850–3900)
MCH: 31.7 pg (ref 27.0–33.0)
MCHC: 33.2 g/dL (ref 32.0–36.0)
MCV: 95.7 fL (ref 80.0–100.0)
MPV: 10.6 fL (ref 7.5–12.5)
Monocytes Relative: 8.5 %
Neutro Abs: 3616 cells/uL (ref 1500–7800)
Neutrophils Relative %: 52.4 %
Platelets: 278 10*3/uL (ref 140–400)
RBC: 4.16 10*6/uL (ref 3.80–5.10)
RDW: 12.6 % (ref 11.0–15.0)
Total Lymphocyte: 35 %
WBC: 6.9 10*3/uL (ref 3.8–10.8)

## 2021-04-01 LAB — LIPID PANEL W/REFLEX DIRECT LDL
Cholesterol: 214 mg/dL — ABNORMAL HIGH (ref <200)
HDL: 91 mg/dL (ref >=50)
LDL Cholesterol (Calc): 96 mg/dL (calc)
Non-HDL Cholesterol (Calc): 123 mg/dL (calc) (ref <130)
Total CHOL/HDL Ratio: 2.4 (calc) (ref <5.0)
Triglycerides: 172 mg/dL — ABNORMAL HIGH (ref <150)

## 2021-04-01 LAB — TSH: TSH: 0.91 mIU/L (ref 0.40–4.50)

## 2021-04-01 MED ORDER — LEVOTHYROXINE SODIUM 75 MCG PO TABS: 75.0000 ug | ORAL_TABLET | Freq: Every day | ORAL | 3 refills | Status: DC

## 2021-04-01 MED ORDER — ATORVASTATIN CALCIUM 40 MG PO TABS: 40.0000 mg | ORAL_TABLET | Freq: Every day | ORAL | 3 refills | Status: DC

## 2021-04-01 NOTE — Progress Notes (Signed)
Gabriela Baker,   Thyroid looks great.  HDL, good cholesterol, is amazing.  LDL, bad cholesterol, is great.  TG better than 1 year ago but still a little elevated. I would start fish oil daily 4000mg .  Liver enzymes continue to improve.  Kidney and glucose look great.  Refills sent to pharmacy.

## 2021-04-02 ENCOUNTER — Other Ambulatory Visit: Payer: Self-pay | Admitting: Physician Assistant

## 2021-04-02 DIAGNOSIS — Z780 Asymptomatic menopausal state: Secondary | ICD-10-CM

## 2021-04-18 ENCOUNTER — Encounter: Payer: Federal, State, Local not specified - PPO | Admitting: Physician Assistant

## 2021-06-19 ENCOUNTER — Encounter: Payer: Self-pay | Admitting: Physician Assistant

## 2021-06-24 ENCOUNTER — Other Ambulatory Visit: Payer: Self-pay

## 2021-06-24 ENCOUNTER — Encounter: Payer: Self-pay | Admitting: Physician Assistant

## 2021-06-24 ENCOUNTER — Ambulatory Visit: Payer: Federal, State, Local not specified - PPO | Admitting: Physician Assistant

## 2021-06-24 VITALS — BP 136/66 | HR 100 | Temp 98.0°F | Ht 64.0 in | Wt 151.0 lb

## 2021-06-24 DIAGNOSIS — M5136 Other intervertebral disc degeneration, lumbar region: Secondary | ICD-10-CM | POA: Diagnosis not present

## 2021-06-24 DIAGNOSIS — M545 Low back pain, unspecified: Secondary | ICD-10-CM | POA: Diagnosis not present

## 2021-06-24 MED ORDER — CYCLOBENZAPRINE HCL 5 MG PO TABS: 5.0000 mg | ORAL_TABLET | Freq: Three times a day (TID) | ORAL | 0 refills | Status: DC | PRN

## 2021-06-24 MED ORDER — KETOROLAC TROMETHAMINE 30 MG/ML IJ SOLN
30.0000 mg | Freq: Once | INTRAMUSCULAR | Status: AC
  Administered 2021-06-24: 15:00:00 30 mg via INTRAMUSCULAR

## 2021-06-24 NOTE — Progress Notes (Signed)
Subjective:    Patient ID: Gabriela Baker, female    DOB: 11/20/1945, 76 y.o.   MRN: [id number]  HPI Pt is a 76 yo female who presents to the clinic with right low back pain for the last 5 days. She was putting up [employer] and lifting a lot of heavy boxes when right side of back starting hurting. Denies any radiating pain, loss of bowel or bladder control or leg weakness. She has been using heat and voltaren gel and pain has decreased to 2/10 today. He has hx of lumbar DDD with last xray 2006. Her right low back just keeps catching a bit and she doesn't like the pain.  .. Active Ambulatory Problems    Diagnosis Date Noted   History of anaphylaxis 04/15/2015   Thyroid activity decreased 04/15/2015   Tobacco dependence 04/15/2015   Hyperlipidemia 04/15/2015   Osteopenia 04/15/2015   Post-menopausal 04/15/2015   Atypical nevus 10/04/2015   Hypertriglyceridemia 10/08/2015   Tobacco use disorder 06/25/2016   Chronic obstructive pulmonary disease ([place]) 06/25/2016   History of smoking greater than 50 pack years 06/25/2016   Aortic atherosclerosis ([place]) 06/25/2016   Former smoker 07/18/2018   Tachycardia 07/18/2018   Elevated liver enzymes 11/22/2019   Combined forms of age-related cataract of left eye 06/20/2019   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     Objective:   Physical Exam Vitals reviewed.  Constitutional:      Appearance: Normal appearance.  HENT:     Head: Normocephalic.  Cardiovascular:     Rate and Rhythm: Normal rate.   Pulmonary:     Effort: Pulmonary effort is normal.  Musculoskeletal:     Right lower leg: No edema.     Left lower leg: No edema.     Comments: No pain to palpation over lumbar spine.  Pain over paraspinal muscles to the right lumbar spine. NROM at waist. Negative straight leg raise, bilaterally.  Strength 5/5 lower ext.   Neurological:     General: No focal deficit present.     Mental Status: She is alert and oriented to person, place, and time.  Psychiatric:        Mood and Affect: Mood normal.          Assessment & Plan:   Marland KitchenMarland KitchenSharvi was seen today for back pain.  Diagnoses and all orders for this visit:  Acute right-sided low back pain without sciatica -     cyclobenzaprine (FLEXERIL) 5 MG tablet; Take 1 tablet (5 mg total) by mouth 3 (three) times daily as needed for muscle spasms. -     ketorolac (TORADOL) 30 MG/ML injection 30 mg  DDD (degenerative disc disease), lumbar   No red flag back pain symptoms today. Hx of lumbar DDD. Pt does need bone density. Order was placed she just needs to call and schedule. Provided number today.  Discussed conservative treatment with tens unit, icy hot patches, heat, stretches. Toradol Im given today. Flexeril give to use as needed. Watch for any sedation.  Ok to use NSAIDs  as needed kidney function looks great 2 months ago. If not improving could consider imaging, prednisone burst, formal PT.

## 2021-06-24 NOTE — Patient Instructions (Addendum)
Tens unit Muscle relaxer Icy hot patches Heat Stretches  [phone] bone density  Low Back Sprain or Strain Rehab Ask your health care provider which exercises are safe for you. Do exercises exactly as told by your health care provider and adjust them as directed. It is normal to feel mild stretching, pulling, tightness, or discomfort as you do these exercises. Stop right away if you feel sudden pain or your pain gets worse. Do not begin these exercises until told by your health care provider. Stretching and range-of-motion exercises These exercises warm up your muscles and joints and improve the movement and flexibility of your back. These exercises also help to relieve pain, numbness, and tingling. Lumbar rotation  Lie on your back on a firm bed or the floor with your knees bent. Straighten your arms out to your sides so each arm forms a 90-degree angle (right angle) with a side of your body. Slowly move (rotate) both of your knees to one side of your body until you feel a stretch in your lower back (lumbar). Try not to let your shoulders lift off the floor. Hold this position for __________ seconds. Tense your abdominal muscles and slowly move your knees back to the starting position. Repeat this exercise on the other side of your body. Repeat __________ times. Complete this exercise __________ times a day. Single knee to chest  Lie on your back on a firm bed or the floor with both legs straight. Bend one of your knees. Use your hands to move your knee up toward your chest until you feel a gentle stretch in your lower back and buttock. Hold your leg in this position by holding on to the front of your knee. Keep your other leg as straight as possible. Hold this position for __________ seconds. Slowly return to the starting position. Repeat with your other leg. Repeat __________ times. Complete this exercise __________ times a day. Prone extension on elbows  Lie on your abdomen on  a firm bed or the floor (prone position). Prop yourself up on your elbows. Use your arms to help lift your chest up until you feel a gentle stretch in your abdomen and your lower back. This will place some of your body weight on your elbows. If this is uncomfortable, try stacking pillows under your chest. Your hips should stay down, against the surface that you are lying on. Keep your hip and back muscles relaxed. Hold this position for __________ seconds. Slowly relax your upper body and return to the starting position. Repeat __________ times. Complete this exercise __________ times a day. Strengthening exercises These exercises build strength and endurance in your back. Endurance is the ability to use your muscles for a long time, even after they get tired. Pelvic tilt This exercise strengthens the muscles that lie deep in the abdomen. Lie on your back on a firm bed or the floor with your legs extended. Bend your knees so they are pointing toward the ceiling and your feet are flat on the floor. Tighten your lower abdominal muscles to press your lower back against the floor. This motion will tilt your pelvis so your tailbone points up toward the ceiling instead of pointing to your feet or the floor. To help with this exercise, you may place a small towel under your lower back and try to push your back into the towel. Hold this position for __________ seconds. Let your muscles relax completely before you repeat this exercise. Repeat __________ times. Complete this exercise  __________ times a day. Alternating arm and leg raises  Get on your hands and knees on a firm surface. If you are on a hard floor, you may want to use padding, such as an exercise mat, to cushion your knees. Line up your arms and legs. Your hands should be directly below your shoulders, and your knees should be directly below your hips. Lift your left leg behind you. At the same time, raise your right arm and straighten it  in front of you. Do not lift your leg higher than your hip. Do not lift your arm higher than your shoulder. Keep your abdominal and back muscles tight. Keep your hips facing the ground. Do not arch your back. Keep your balance carefully, and do not hold your breath. Hold this position for __________ seconds. Slowly return to the starting position. Repeat with your right leg and your left arm. Repeat __________ times. Complete this exercise __________ times a day. Abdominal set with straight leg raise  Lie on your back on a firm bed or the floor. Bend one of your knees and keep your other leg straight. Tense your abdominal muscles and lift your straight leg up, 4-6 inches (10-15 cm) off the ground. Keep your abdominal muscles tight and hold this position for __________ seconds. Do not hold your breath. Do not arch your back. Keep it flat against the ground. Keep your abdominal muscles tense as you slowly lower your leg back to the starting position. Repeat with your other leg. Repeat __________ times. Complete this exercise __________ times a day. Single leg lower with bent knees Lie on your back on a firm bed or the floor. Tense your abdominal muscles and lift your feet off the floor, one foot at a time, so your knees and hips are bent in 90-degree angles (right angles). Your knees should be over your hips and your lower legs should be parallel to the floor. Keeping your abdominal muscles tense and your knee bent, slowly lower one of your legs so your toe touches the ground. Lift your leg back up to return to the starting position. Do not hold your breath. Do not let your back arch. Keep your back flat against the ground. Repeat with your other leg. Repeat __________ times. Complete this exercise __________ times a day. Posture and body mechanics Good posture and healthy body mechanics can help to relieve stress in your body's tissues and joints. Body mechanics refers to the  movements and positions of your body while you do your daily activities. Posture is part of body mechanics. Good posture means: Your spine is in its natural S-curve position (neutral). Your shoulders are pulled back slightly. Your head is not tipped forward (neutral). Follow these guidelines to improve your posture and body mechanics in your everyday activities. Standing  When standing, keep your spine neutral and your feet about hip-width apart. Keep a slight bend in your knees. Your ears, shoulders, and hips should line up. When you do a task in which you stand in one place for a long time, place one foot up on a stable object that is 2-4 inches (5-10 cm) high, such as a footstool. This helps keep your spine neutral. Sitting  When sitting, keep your spine neutral and keep your feet flat on the floor. Use a footrest, if necessary, and keep your thighs parallel to the floor. Avoid rounding your shoulders, and avoid tilting your head forward. When working at a desk or a Teaching laboratory technician, keep your desk  at a height where your hands are slightly lower than your elbows. Slide your chair under your desk so you are close enough to maintain good posture. When working at a computer, place your monitor at a height where you are looking straight ahead and you do not have to tilt your head forward or downward to look at the screen. Resting When lying down and resting, avoid positions that are most painful for you. If you have pain with activities such as sitting, bending, stooping, or squatting, lie in a position in which your body does not bend very much. For example, avoid curling up on your side with your arms and knees near your chest (fetal position). If you have pain with activities such as standing for a long time or reaching with your arms, lie with your spine in a neutral position and bend your knees slightly. Try the following positions: Lying on your side with a pillow between your knees. Lying on your  back with a pillow under your knees. Lifting  When lifting objects, keep your feet at least shoulder-width apart and tighten your abdominal muscles. Bend your knees and hips and keep your spine neutral. It is important to lift using the strength of your legs, not your back. Do not lock your knees straight out. Always ask for help to lift heavy or awkward objects. This information is not intended to replace advice given to you by your health care provider. Make sure you discuss any questions you have with your health care provider. Document Revised: 08/05/2020 Document Reviewed: 08/05/2020 Elsevier Patient Education  [place].

## 2021-06-27 ENCOUNTER — Encounter: Payer: Self-pay | Admitting: Physician Assistant

## 2021-06-27 DIAGNOSIS — M545 Low back pain, unspecified: Secondary | ICD-10-CM | POA: Insufficient documentation

## 2021-06-27 DIAGNOSIS — M5136 Other intervertebral disc degeneration, lumbar region: Secondary | ICD-10-CM | POA: Insufficient documentation

## 2021-10-23 LAB — HM DIABETES EYE EXAM

## 2021-10-30 ENCOUNTER — Other Ambulatory Visit: Payer: Self-pay | Admitting: Physician Assistant

## 2021-10-30 DIAGNOSIS — J40 Bronchitis, not specified as acute or chronic: Secondary | ICD-10-CM

## 2022-01-30 ENCOUNTER — Ambulatory Visit
Admission: RE | Admit: 2022-01-30 | Discharge: 2022-01-30 | Disposition: A | Discharge status: Home / Self Care | Payer: Federal, State, Local not specified - PPO | Source: Ambulatory Visit | Attending: Family Medicine | Admitting: Family Medicine

## 2022-01-30 VITALS — BP 145/82 | HR 74 | Temp 99.4°F | Resp 20 | Ht 64.0 in | Wt 150.0 lb

## 2022-01-30 DIAGNOSIS — J069 Acute upper respiratory infection, unspecified: Secondary | ICD-10-CM | POA: Diagnosis not present

## 2022-01-30 DIAGNOSIS — R051 Acute cough: Secondary | ICD-10-CM | POA: Diagnosis not present

## 2022-01-30 LAB — SARS CORONAVIRUS 2 BY RT PCR: SARS Coronavirus 2 by RT PCR: NEGATIVE

## 2022-01-30 NOTE — Discharge Instructions (Signed)
Take plain guaifenesin ('1200mg'$  extended release tabs such as Mucinex) twice daily, with plenty of water, for cough and congestion. Get adequate rest.   Continue using saline nasal spray several times daily and saline nasal irrigation (AYR is a common brand).    Try warm salt water gargles for sore throat.  Stop all antihistamines for now, and other non-prescription cough/cold preparations. May take Ibuprofen '200mg'$ , 4 tabs every 8 hours with food for chest/sternum discomfort.  May take Delsym Cough Suppressant ("12 Hour Cough Relief") at bedtime for nighttime cough.   If your COVID-19 test is positive, isolate yourself for five days from today.  At the end of five days you may end isolation if your symptoms have cleared or improved, and you have not had a fever for 24 hours. At this time you should wear a mask for five more days when you are around others.   If symptoms become significantly worse during the night or over the weekend, proceed to the local emergency room.

## 2022-01-30 NOTE — ED Provider Notes (Signed)
[facility]    CSN: [id number] Arrival date & time: 01/30/22  1142      History   Chief Complaint Chief Complaint  Patient presents with   Sore Throat   Cough   Nasal Congestion    HPI Gabriela Baker is a 76 y.o. female.   Yesterday morning patient awoke with sore throat followed by productive cough, fatigue, sneezing, and sinus congestion.  She denies fevers, chills, and sweats.  She denies pleuritic pain and shortness of breath. She had a history of seasonal rhinitis (spring and fall).  She quit smoking about 1.5 years ago.  The history is provided by the patient.    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 No pertinent surgical history.  OB History   No obstetric history on file.      Home Medications    Prior to Admission medications   Medication Sig Start Date End Date Taking? Authorizing Provider  albuterol (VENTOLIN HFA) 108 (90 Base) MCG/ACT inhaler TAKE 2  PUFFS BY MOUTH EVERY 6 HOURS AS NEEDED FOR WHEEZE OR SHORTNESS OF BREATH 10/30/21   Breeback, Jade L, PA-C  aspirin EC 81 MG tablet Take 81 mg by mouth daily.    [provider]  atorvastatin (LIPITOR) 40 MG tablet Take 1 tablet (40 mg total) by mouth daily. 04/01/21   Breeback, Luvenia Starch L, PA-C  Calcium Citrate-Vitamin D (CALCIUM + D PO) Take by mouth daily.    [provider]  cyclobenzaprine (FLEXERIL) 5 MG tablet Take 1 tablet (5 mg total) by mouth 3 (three) times daily as needed for muscle spasms. 06/24/21   Breeback, Luvenia Starch L, PA-C  diclofenac Sodium (VOLTAREN) 1 % GEL Apply 4 g topically 4 (four) times daily. To affected joint. 04/23/20   Breeback, Royetta Car, PA-C  levothyroxine (SYNTHROID) 75 MCG tablet Take 1 tablet (75 mcg total) by mouth daily. 04/01/21   Breeback, Royetta Car, PA-C  Multiple Vitamins-Minerals (CENTRUM SILVER PO) Take by mouth daily.    [provider]  umeclidinium-vilanterol (ANORO ELLIPTA) 62.5-25 MCG/ACT AEPB Inhale 1 puff into the lungs daily at 6 (six) AM. 03/31/21   Donella Stade, PA-C    Family History Family History  Problem Relation Age of Onset   Cancer Mother    Heart failure Father    Cancer Father     Social History Social History   Tobacco Use   Smoking status: Former    Packs/day: 1.00    Years: 54.00    Total pack years: 54.00  Types: Cigarettes    Quit date: 09/02/2019    Years since quitting: 2.4   Smokeless tobacco: Never  Vaping Use   Vaping Use: Never used  Substance Use Topics   Alcohol use: Yes    Alcohol/week: 3.0 standard drinks of alcohol    Types: 3 Glasses of wine per week    Comment: 21 glasses a week   Drug use: No     Allergies   Bee venom   Review of Systems Review of Systems + sore throat + cough + sneezing No pleuritic pain No wheezing + nasal congestion + post-nasal drainage No sinus pain/pressure No itchy/red eyes No earache No hemoptysis No SOB No fever/chills No nausea No  vomiting No abdominal pain No diarrhea No urinary symptoms No skin rash + fatigue No myalgias + headache Used OTC meds (Claritin, Saline spray) without relief   Physical Exam Triage Vital Signs ED Triage Vitals  Enc Vitals Group     BP 01/30/22 1249 (!) 145/82     Pulse Rate 01/30/22 1249 74     Resp 01/30/22 1249 20     Temp 01/30/22 1249 99.4 F (37.4 C)     Temp Source 01/30/22 1249 Oral     SpO2 01/30/22 1249 (!) 74 %     Weight 01/30/22 1246 150 lb (68 kg)     Height 01/30/22 1246 '5\' 4"'$  (1.626 m)     Head Circumference --      Peak Flow --      Pain Score 01/30/22 1246 0     Pain Loc --      Pain Edu? --      Excl. in [place]? --    No data found.  Updated Vital Signs BP (!) 145/82 (BP Location: Right Arm)   Pulse 74   Temp 99.4 F (37.4 C) (Oral)   Resp 20   Ht '5\' 4"'$  (1.626 m)   Wt 68 kg   SpO2 (!) 74%   BMI 25.75 kg/m   Visual Acuity Right Eye Distance:   Left Eye Distance:   Bilateral Distance:    Right Eye Near:   Left Eye Near:    Bilateral Near:     Physical Exam Nursing notes and Vital Signs reviewed. Appearance:  Patient appears stated age, and in no acute distress Eyes:  Pupils are equal, round, and reactive to light and accomodation.  Extraocular movement is intact.  Conjunctivae are not inflamed  Ears:  Canals normal.  Tympanic membranes normal.  Nose:  Mildly congested turbinates.  No sinus tenderness.   Pharynx:  Normal Neck:  Supple.  Mildly enlarged lateral nodes are present, tender to palpation on the left.   Lungs:  Clear to auscultation.  Breath sounds are equal.  Moving air well. Heart:  Regular rate and rhythm without murmurs, rubs, or gallops.  Abdomen:  Nontender without masses or hepatosplenomegaly.  Bowel sounds are present.  No CVA or flank tenderness.  Extremities:  No edema.  Skin:  No rash present.   UC Treatments / Results  Labs (all labs ordered are listed, but only abnormal results are displayed) Labs Reviewed  SARS  CORONAVIRUS 2 BY RT PCR    EKG   Radiology No results found.  Procedures Procedures (including critical care time)  Medications Ordered in UC Medications - No data to display  Initial Impression / Assessment and Plan / UC Course  I have reviewed the triage vital signs and the nursing notes.  Pertinent  labs & imaging results that were available during my care of the patient were reviewed by me and considered in my medical decision making (see chart for details).    There is no evidence of bacterial infection today.  Treat symptomatically for now  COVID PCR pending. Followup with Family Doctor if not improved in one week.   Final Clinical Impressions(s) / UC Diagnoses   Final diagnoses:  Acute cough  Viral URI with cough     Discharge Instructions      Take plain guaifenesin ('1200mg'$  extended release tabs such as Mucinex) twice daily, with plenty of water, for cough and congestion. Get adequate rest.   Continue using saline nasal spray several times daily and saline nasal irrigation (AYR is a common brand).    Try warm salt water gargles for sore throat.  Stop all antihistamines for now, and other non-prescription cough/cold preparations. May take Ibuprofen '200mg'$ , 4 tabs every 8 hours with food for chest/sternum discomfort.  May take Delsym Cough Suppressant ("12 Hour Cough Relief") at bedtime for nighttime cough.   If your COVID-19 test is positive, isolate yourself for five days from today.  At the end of five days you may end isolation if your symptoms have cleared or improved, and you have not had a fever for 24 hours. At this time you should wear a mask for five more days when you are around others.   If symptoms become significantly worse during the night or over the weekend, proceed to the local emergency room.         ED Prescriptions   None       Kandra Nicolas, MD 02/01/22 1319

## 2022-01-30 NOTE — ED Triage Notes (Signed)
Pt presents to Urgent Care with c/o cough, sore throat, and sneezing since yesterday. Has not done home COVID test. Afebrile.

## 2022-02-23 ENCOUNTER — Ambulatory Visit: Payer: Federal, State, Local not specified - PPO | Admitting: Family Medicine

## 2022-02-23 ENCOUNTER — Encounter: Payer: Self-pay | Admitting: Family Medicine

## 2022-02-23 VITALS — BP 114/70 | HR 90 | Ht 64.0 in | Wt 156.0 lb

## 2022-02-23 DIAGNOSIS — T148XXA Other injury of unspecified body region, initial encounter: Secondary | ICD-10-CM | POA: Insufficient documentation

## 2022-02-23 DIAGNOSIS — Z23 Encounter for immunization: Secondary | ICD-10-CM

## 2022-02-23 NOTE — Patient Instructions (Signed)
Muscle Strain A muscle strain, or pulled muscle, happens when a muscle is stretched beyond its normal length. This can tear some muscle fibers and cause pain. Usually, it takes 1-2 weeks to heal from a muscle strain. Full healing normally takes 5-6 weeks. What are the causes? This condition is caused when a sudden force is placed on a muscle and stretches it too far. This can happen with a fall, while lifting, or during sports. What increases the risk? You are more likely to develop a muscle strain if you are an athlete or you do a lot of physical activity. What are the signs or symptoms? Pain. Tenderness. Bruising. Swelling. Trouble using the muscle. How is this treated? This condition is first treated with PRICE therapy. This involves: Protecting your muscle from being injured again. Resting your injured muscle. Icing your injured muscle. Putting pressure (compression) on your injured muscle. This may be done with a splint or elastic bandage. Raising (elevating) your injured muscle. Your doctor may also recommend medicine for pain. Follow these instructions at home: If you have a splint that can be taken off: Wear the splint as told by your doctor. Take it off only as told by your doctor. Check the skin around the splint every day. Tell your doctor if you see problems. Loosen the splint if your fingers or toes: Tingle. Become numb. Turn cold and blue. Keep the splint clean. If the splint is not waterproof: Do not let it get wet. Cover it with a watertight covering when you take a bath or a shower. Managing pain, stiffness, and swelling  If told, put ice on your injured area. To do this: If you have a removable splint, take it off as told by your doctor. Put ice in a plastic bag. Place a towel between your skin and the bag. Leave the ice on for 20 minutes, 2-3 times a day. Take off the ice if your skin turns bright red. This is very important. If you cannot feel pain, heat,  or cold, you have a greater risk of damage to the area. Move your fingers or toes often. Raise the injured area above the level of your heart while you are sitting or lying down. Wear an elastic bandage as told by your doctor. Make sure it is not too tight. General instructions Take over-the-counter and prescription medicines only as told by your doctor. This may include: Medicines for pain and swelling that are taken by mouth or put on the skin. Medicines to help relax your muscles. Limit your activity. Rest your injured muscle as told by your doctor. Your doctor may say that gentle movements are okay. If physical therapy was prescribed, do exercises as told by your doctor. Do not put pressure on any part of the splint until it is fully hardened. This may take many hours. Do not smoke or use any products that contain nicotine or tobacco. If you need help quitting, ask your doctor. Ask your doctor when it is safe to drive if you have a splint. Keep all follow-up visits. How is this prevented? Warm up before you exercise. This helps to prevent more muscle strains. Contact a doctor if: You have more pain or swelling in the injured area. Get help right away if: You have any of these problems in your injured area: Numbness. Tingling. Less strength than normal. Summary A muscle strain is an injury that happens when a muscle is stretched beyond normal length. This condition is first treated with PRICE   therapy. This includes protecting, resting, icing, adding pressure, and raising your injury. Limit your activity. Rest your injured muscle as told by your doctor. Your doctor may say that gentle movements are okay. Warm up before you exercise. This helps to prevent more muscle strains. This information is not intended to replace advice given to you by your health care provider. Make sure you discuss any questions you have with your health care provider. Document Revised: 08/05/2020 Document  Reviewed: 08/05/2020 Elsevier Patient Education  2023 [employer]  

## 2022-02-23 NOTE — Assessment & Plan Note (Signed)
Symptoms consistent with muscle strain.  They seem to be improving.  Ibuprofen has been helpful.  Recommend application of ice a couple times a day as well.  She will let me know if not fully resolving over the next 1 to 2 weeks or if having worsening symptoms.

## 2022-02-23 NOTE — Progress Notes (Signed)
Gabriela Baker - 76 y.o. female MRN [id number]  Date of birth: [birth year]  Subjective Chief Complaint  Patient presents with   Leg Pain    HPI Gabriela Baker is a 76 y.o. female here today with complaint of leg pain.  Pain in located on the posterior thigh of the R leg.  Symptoms started a couple of days ago.  Reports that she rearranged the furniture in her bedroom the prior onset.  She has had some improvement since this first started and gets pretty good relief with ibuprofen.  She denies back pain, radiation to the lower leg, numbness, tingling, weakness, fever or chills.  ROS:  A comprehensive ROS was completed and negative except as noted per HPI  Allergies  Allergen Reactions   Bee Venom Other (See Comments)    Past Medical History:  Diagnosis Date   Hyperlipidemia    Thyroid disease     History reviewed. No pertinent surgical history.  Social History   Socioeconomic History   Marital status: Divorced    Spouse name: Not on file   Number of children: Not on file   Years of education: Not on file   Highest education level: Not on file  Occupational History   Not on file  Tobacco Use   Smoking status: Former    Packs/day: 1.00    Years: 54.00    Total pack years: 54.00    Types: Cigarettes    Quit date: 09/02/2019    Years since quitting: 2.4   Smokeless tobacco: Never  Vaping Use   Vaping Use: Never used  Substance and Sexual Activity   Alcohol use: Yes    Alcohol/week: 3.0 standard drinks of alcohol    Types: 3 Glasses of wine per week    Comment: 21 glasses a week   Drug use: No   Sexual activity: Not Currently  Other Topics Concern   Not on file  Social History Narrative   Not on file   Social Determinants of Health   Financial Resource Strain: Not on file  Food Insecurity: Not on file  Transportation Needs: Not on file  Physical Activity: Not on file  Stress: Not on file  Social Connections: Not on file    Family History  Problem  Relation Age of Onset   Cancer Mother    Heart failure Father    Cancer Father     Health Maintenance  Topic Date Due   MAMMOGRAM  04/01/2022 (Originally 01/05/2012)   COVID-19 Vaccine (4 - Moderna risk series) 07/02/2022 (Originally 05/16/2021)   TETANUS/TDAP  06/13/2023   Pneumonia Vaccine 50+ Years old  Completed   INFLUENZA VACCINE  Completed   DEXA SCAN  Completed   Hepatitis C Screening  Completed   Zoster Vaccines- Shingrix  Completed   HPV VACCINES  Aged Out   Fecal DNA (Cologuard)  Discontinued     ----------------------------------------------------------------------------------------------------------------------------------------------------------------------------------------------------------------- Physical Exam BP 114/70 (BP Location: Left Arm, Patient Position: Sitting, Cuff Size: Normal)   Pulse 90   Ht '5\' 4"'$  (1.626 m)   Wt 156 lb (70.8 kg)   SpO2 99%   BMI 26.78 kg/m   Physical Exam Constitutional:      Appearance: Normal appearance.  Eyes:     General: No scleral icterus. Cardiovascular:     Rate and Rhythm: Normal rate and regular rhythm.  Pulmonary:     Effort: Pulmonary effort is normal.     Breath sounds: Normal breath sounds.  Musculoskeletal:  Cervical back: Neck supple.  Neurological:     Mental Status: She is alert.  Psychiatric:        Mood and Affect: Mood normal.        Behavior: Behavior normal.     ------------------------------------------------------------------------------------------------------------------------------------------------------------------------------------------------------------------- Assessment and Plan  Muscle strain Symptoms consistent with muscle strain.  They seem to be improving.  Ibuprofen has been helpful.  Recommend application of ice a couple times a day as well.  She will let me know if not fully resolving over the next 1 to 2 weeks or if having worsening symptoms.   No orders of the defined  types were placed in this encounter.   No follow-ups on file.    This visit occurred during the SARS-CoV-2 public health emergency.  Safety protocols were in place, including screening questions prior to the visit, additional usage of staff PPE, and extensive cleaning of exam room while observing appropriate contact time as indicated for disinfecting solutions. '

## 2022-02-25 ENCOUNTER — Encounter: Payer: Self-pay | Admitting: Physician Assistant

## 2022-02-25 ENCOUNTER — Ambulatory Visit (INDEPENDENT_AMBULATORY_CARE_PROVIDER_SITE_OTHER): Payer: Federal, State, Local not specified - PPO

## 2022-02-25 DIAGNOSIS — M810 Age-related osteoporosis without current pathological fracture: Secondary | ICD-10-CM | POA: Insufficient documentation

## 2022-02-25 DIAGNOSIS — Z780 Asymptomatic menopausal state: Secondary | ICD-10-CM | POA: Diagnosis not present

## 2022-02-25 NOTE — Progress Notes (Signed)
Bone density is in the osteoporosis range. We need to talk about medication choices to help strengthen your bones. Can be virtual or in person.

## 2022-02-27 ENCOUNTER — Encounter: Payer: Self-pay | Admitting: Physician Assistant

## 2022-02-27 MED ORDER — PREDNISONE 50 MG PO TABS: ORAL_TABLET | ORAL | 0 refills | Status: DC

## 2022-03-02 ENCOUNTER — Encounter: Payer: Self-pay | Admitting: Physician Assistant

## 2022-03-02 ENCOUNTER — Ambulatory Visit: Payer: Federal, State, Local not specified - PPO | Admitting: Physician Assistant

## 2022-03-02 VITALS — BP 121/79 | HR 105 | Ht 64.0 in | Wt 155.1 lb

## 2022-03-02 DIAGNOSIS — M545 Low back pain, unspecified: Secondary | ICD-10-CM

## 2022-03-02 DIAGNOSIS — M816 Localized osteoporosis [Lequesne]: Secondary | ICD-10-CM

## 2022-03-02 DIAGNOSIS — M65341 Trigger finger, right ring finger: Secondary | ICD-10-CM | POA: Insufficient documentation

## 2022-03-02 MED ORDER — ALENDRONATE SODIUM 70 MG PO TABS: 70.0000 mg | ORAL_TABLET | ORAL | 11 refills | Status: DC

## 2022-03-02 NOTE — Progress Notes (Signed)
Established Patient Office Visit  Subjective   Patient ID: Gabriela Baker, female    DOB: Sep 26, 1945  Age: 76 y.o. MRN: [id number]  Chief Complaint  Patient presents with   Results    Bone denisty results and related medications consult.    R hand ring finger locking     HPI Pt is a 76 yo F here today for f/u on bone density results showing osteoporosis. She is open to discussing medication options.  She exercises but has been resting this past week due to right posterior leg pain onset 1 week ago. The pain radiates from the buttock to the calf with no weakness, numbness, tingling. Prednisone and flexeril helped significantly.  Pt also c/o her right ring finger locking. She denies any pain associated. No trauma. Occurring for the last 5 days. No other episodes of this.    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 Social History   Tobacco Use   Smoking status: Former    Packs/day: 1.00    Years: 54.00    Total pack years: 54.00    Types:  Cigarettes    Quit date: 09/02/2019    Years since quitting: 2.4   Smokeless tobacco: Never  Vaping Use   Vaping Use: Never used  Substance Use Topics   Alcohol use: Yes    Alcohol/week: 3.0 standard drinks of alcohol    Types: 3 Glasses of wine per week    Comment: 21 glasses a week   Drug use: No   Social History   Socioeconomic History   Marital status: Divorced    Spouse name: Not on file   Number of children: Not on file   Years of education: Not on file   Highest education level: Not on file  Occupational History   Not on file  Tobacco Use   Smoking status: Former    Packs/day: 1.00    Years: 54.00    Total pack years: 54.00    Types: Cigarettes    Quit date: 09/02/2019    Years since quitting: 2.4   Smokeless tobacco: Never  Vaping Use   Vaping Use: Never used  Substance and Sexual Activity   Alcohol use: Yes    Alcohol/week: 3.0 standard drinks of alcohol    Types: 3 Glasses of wine per week    Comment: 21 glasses a week   Drug use: No   Sexual activity: Not Currently  Other Topics Concern   Not on file  Social History Narrative   Not on file   Social Determinants of Health   Financial Resource Strain:  Not on file  Food Insecurity: Not on file  Transportation Needs: Not on file  Physical Activity: Not on file  Stress: Not on file  Social Connections: Not on file  Intimate Partner Violence: Not on file   Family Status  Relation Name Status   Mother  Deceased   Father  Deceased   Family History  Problem Relation Age of Onset   Cancer Mother    Heart failure Father    Cancer Father    Allergies  Allergen Reactions   Bee Venom Other (See Comments)      ROS   See HPI.  Objective:     BP 121/79   Pulse (!) 105   Ht _0  (1.626 m)   Wt 155 lb 1.3 oz (70.3 kg)   SpO2 100%   BMI 26.62 kg/m  BP Readings from Last 3 Encounters:  03/02/22 121/79  02/23/22 114/70  01/30/22 (!) 145/82   Wt Readings from Last 3 Encounters:  03/02/22  155 lb 1.3 oz (70.3 kg)  02/23/22 156 lb (70.8 kg)  01/30/22 150 lb (68 kg)   SpO2 Readings from Last 3 Encounters:  03/02/22 100%  02/23/22 99%  01/30/22 (!) 74%      Physical Exam Constitutional:      Appearance: Normal appearance.  HENT:     Head: Normocephalic.  Pulmonary:     Effort: Pulmonary effort is normal.  Neurological:     Mental Status: She is alert and oriented to person, place, and time.  Psychiatric:        Mood and Affect: Mood normal.        Behavior: Behavior normal.      Last CBC Lab Results  Component Value Date   WBC 6.9 03/31/2021   HGB 13.2 03/31/2021   HCT 39.8 03/31/2021   MCV 95.7 03/31/2021   MCH 31.7 03/31/2021   RDW 12.6 03/31/2021   PLT 278 77/93/9030   Last metabolic panel Lab Results  Component Value Date   GLUCOSE 95 03/31/2021   NA 141 03/31/2021   K 4.5 03/31/2021   CL 103 03/31/2021   CO2 29 03/31/2021   BUN 10 03/31/2021   CREATININE 0.71 03/31/2021   EGFR 89 03/31/2021   CALCIUM 10.0 03/31/2021   PROT 7.5 03/31/2021   ALBUMIN 4.3 10/07/2015   BILITOT 0.5 03/31/2021   ALKPHOS 80 10/07/2015   AST 29 03/31/2021   ALT 34 (H) 03/31/2021   Last lipids Lab Results  Component Value Date   CHOL 214 (H) 03/31/2021   HDL 91 03/31/2021   LDLCALC 96 03/31/2021   TRIG 172 (H) 03/31/2021   CHOLHDL 2.4 03/31/2021   Last hemoglobin A1c No results found for: "HGBA1C" Last thyroid functions Lab Results  Component Value Date   TSH 0.91 03/31/2021   Last vitamin D Lab Results  Component Value Date   VD25OH 47 10/07/2015       The 10-year ASCVD risk score (Arnett DK, et al., 2019) is: 23%    Assessment & Plan:    Marland KitchenMarland KitchenJleigh was seen today for results and r hand ring finger locking .  Diagnoses and all orders for this visit:  Localized osteoporosis without current pathological fracture -     alendronate (FOSAMAX) 70 MG tablet; Take 1 tablet (70 mg total) by mouth every 7 (seven) days. -     COMPLETE METABOLIC  PANEL WITH GFR  Trigger finger, right ring finger  Acute right-sided low back pain without sciatica  Discussed medication options for osteoporosis including bisphosphonate oral weekly and Prolia injection every 6 mos.  Pt was educated on risk vs benefit of each Pt would like to try Fosamax and monitor for dyspepsia symptoms   CMP ordered to monitor Ca Follow up bone density in 2 years  HO given Recommended resistance exercises for osteoporosis and at home exercises for sciatica pain with HO given  Recommended bracing trigger finger in addition to heat and massage and educated on nature of condition, HO also given Follow up with any new or worsening symptoms    Iran Planas, PA-C

## 2022-03-02 NOTE — Patient Instructions (Addendum)
Sciatica Rehab Ask your health care provider which exercises are safe for you. Do exercises exactly as told by your health care provider and adjust them as directed. It is normal to feel mild stretching, pulling, tightness, or discomfort as you do these exercises. Stop right away if you feel sudden pain or your pain gets worse. Do not begin these exercises until told by your health care provider. Stretching and range-of-motion exercises These exercises warm up your muscles and joints and improve the movement and flexibility of your hips and back. These exercises also help to relieve pain, numbness, and tingling. Sciatic nerve glide  Sit in a chair with your head facing down toward your chest. Place your hands behind your back. Let your shoulders slump forward. Slowly straighten one of your legs while you tilt your head back as if you are looking toward the ceiling. Only straighten your leg as far as you can without making your symptoms worse. Hold this position for __________ seconds. Slowly return your leg and head back to the starting position. Repeat with your other leg. Repeat __________ times. Complete this exercise __________ times a day. Knee to chest with hip adduction and internal rotation  Lie on your back on a firm surface with both legs straight. Bend one of your knees and move it up toward your chest until you feel a gentle stretch in your lower back and buttock. Then, move your knee toward the shoulder that is on the opposite side from your leg. This is hip adduction and internal rotation. Hold your leg in this position by holding on to the front of your knee. Hold this position for __________ seconds. Slowly return to the starting position. Repeat with your other leg. Repeat __________ times. Complete this exercise __________ times a day. Prone extension on elbows  Lie on your abdomen on a firm surface. A bed may be too soft for this exercise. Prop yourself up on your  elbows. Use your arms to help lift your chest up until you feel a gentle stretch in your abdomen and your lower back. This will place some of your body weight on your elbows. If this is uncomfortable, try stacking pillows under your chest. Your hips should stay down, against the surface that you are lying on. Keep your hip and back muscles relaxed. Hold this position for __________ seconds. Slowly relax your upper body and return to the starting position. Repeat __________ times. Complete this exercise __________ times a day. Strengthening exercises These exercises build strength and endurance in your back. Endurance is the ability to use your muscles for a long time, even after they get tired. Pelvic tilt This exercise strengthens the muscles that lie deep in the abdomen. Lie on your back on a firm surface. Bend your knees and keep your feet flat on the surface. Tense your abdominal muscles. Tip your pelvis up toward the ceiling and flatten your lower back into the firm surface. To help with this exercise, you may place a small towel under your lower back and try to push your back into the towel. Hold this position for __________ seconds. Let your muscles relax completely before you repeat this exercise. Repeat __________ times. Complete this exercise __________ times a day. Alternating arm and leg raises  Get on your hands and knees on a firm surface. If you are on a hard floor, you may want to use padding, such as an exercise mat, to cushion your knees. Line up your arms and legs. Your  hands should be directly below your shoulders, and your knees should be directly below your hips. Lift your left leg behind you. At the same time, raise your right arm and straighten it in front of you. Do not lift your leg higher than your hip. Do not lift your arm higher than your shoulder. Keep your abdominal and back muscles tight. Keep your hips facing the ground. Do not arch your back. Keep your  balance carefully, and do not hold your breath. Hold this position for __________ seconds. Slowly return to the starting position. Repeat with your right leg and your left arm. Repeat __________ times. Complete this exercise __________ times a day. Posture and body mechanics Good posture and healthy body mechanics can help to relieve stress in your body's tissues and joints. Body mechanics refers to the movements and positions of your body while you do your daily activities. Posture is part of body mechanics. Good posture means: Your spine is in its natural S-curve position (neutral). Your shoulders are pulled back slightly. Your head is not tipped forward. Follow these guidelines to improve your posture and body mechanics in your everyday activities. Standing  When standing, keep your spine neutral and your feet about hip width apart. Keep a slight bend in your knees. Your ears, shoulders, and hips should line up. When you do a task in which you stand in one place for a long time, place one foot up on a stable object that is 2-4 inches (5-10 cm) high, such as a footstool. This helps keep your spine neutral. Sitting  When sitting, keep your spine neutral and keep your feet flat on the floor. Use a footrest, if necessary, and keep your thighs parallel to the floor. Avoid rounding your shoulders, and avoid tilting your head forward. When working at a desk or a computer, keep your desk at a height where your hands are slightly lower than your elbows. Slide your chair under your desk so you are close enough to maintain good posture. When working at a computer, place your monitor at a height where you are looking straight ahead and you do not have to tilt your head forward or downward to look at the screen. Resting  When lying down and resting, avoid positions that are most painful for you. If you have pain with activities such as sitting, bending, stooping, or squatting, lie in a position in which  your body does not bend very much. For example, avoid curling up on your side with your arms and knees near your chest (fetal position). If you have pain with activities such as standing for a long time or reaching with your arms, lie with your spine in a neutral position and bend your knees slightly. Try the following positions: Lying on your side with a pillow between your knees. Lying on your back with a pillow under your knees. Lifting  When lifting objects, keep your feet at least shoulder width apart and tighten your abdominal muscles. Bend your knees and hips and keep your spine neutral. It is important to lift using the strength of your legs, not your back. Do not lock your knees straight out. Always ask for help to lift heavy or awkward objects. This information is not intended to replace advice given to you by your health care provider. Make sure you discuss any questions you have with your health care provider. Document Revised: 08/26/2021 Document Reviewed: 08/26/2021 Elsevier Patient Education  [place].   Denosumab Injection (Osteoporosis)  What is this medication? DENOSUMAB (den oh SUE mab) prevents and treats osteoporosis. It works by Paramedic stronger and less likely to break (fracture). It is a monoclonal antibody. This medicine may be used for other purposes; ask your health care provider or pharmacist if you have questions. COMMON BRAND NAME(S): Prolia What should I tell my care team before I take this medication? They need to know if you have any of these conditions: Dental or gum disease, or plan to have dental surgery or a tooth pulled Infection Kidney disease Low levels of calcium or vitamin D in your blood On dialysis Poor nutrition Skin conditions Thyroid disease, or have had thyroid or parathyroid surgery Trouble absorbing minerals in your stomach or intestine An unusual reaction to denosumab, other medications, foods, dyes, or  preservatives Pregnant or trying to get pregnant Breast-feeding How should I use this medication? This medication is injected under the skin. It is given by your care team in a hospital or clinic setting. A special MedGuide will be given to you before each treatment. Be sure to read this information carefully each time. Talk to your care team about the use of this medication in children. Special care may be needed. Overdosage: If you think you have taken too much of this medicine contact a poison control center or emergency room at once. NOTE: This medicine is only for you. Do not share this medicine with others. What if I miss a dose? Keep appointments for follow-up doses. It is important not to miss your dose. Call your care team if you are unable to keep an appointment. What may interact with this medication? Do not take this medication with any of the following: Other medications that contain denosumab This medication may also interact with the following: Medications that lower your chance of fighting infection Steroid medications, such as prednisone or cortisone This list may not describe all possible interactions. Give your health care provider a list of all the medicines, herbs, non-prescription drugs, or dietary supplements you use. Also tell them if you smoke, drink alcohol, or use illegal drugs. Some items may interact with your medicine. What should I watch for while using this medication? Your condition will be monitored carefully while you are receiving this medication. You may need blood work while taking this medication. This medication may increase your risk of getting an infection. Call your care team for advice if you get a fever, chills, sore throat, or other symptoms of a cold or flu. Do not treat yourself. Try to avoid being around people who are sick. Tell your dentist and dental surgeon that you are taking this medication. You should not have major dental surgery while on  this medication. See your dentist to have a dental exam and fix any dental problems before starting this medication. Take good care of your teeth while on this medication. Make sure you see your dentist for regular follow-up appointments. You should make sure you get enough calcium and vitamin D while you are taking this medication. Discuss the foods you eat and the vitamins you take with your care team. Talk to your care team if you are pregnant or think you might be pregnant. This medication can cause serious birth defects if taken during pregnancy and for 5 months after the last dose. You will need a negative pregnancy test before starting this medication. Contraception is recommended while taking this medication and for 5 months after the last dose. Your care team can help you  find the option that works for you. Talk to your care team before breastfeeding. Changes to your treatment plan may be needed. What side effects may I notice from receiving this medication? Side effects that you should report to your care team as soon as possible: Allergic reactions--skin rash, itching, hives, swelling of the face, lips, tongue, or throat Infection--fever, chills, cough, sore throat, wounds that don't heal, pain or trouble when passing urine, general feeling of discomfort or being unwell Low calcium level--muscle pain or cramps, confusion, tingling, or numbness in the hands or feet Osteonecrosis of the jaw--pain, swelling, or redness in the mouth, numbness of the jaw, poor healing after dental work, unusual discharge from the mouth, visible bones in the mouth Severe bone, joint, or muscle pain Skin infection--skin redness, swelling, warmth, or pain Side effects that usually do not require medical attention (report these to your care team if they continue or are bothersome): Back pain Headache Joint pain Muscle pain Pain in the hands, arms, legs, or feet Runny or stuffy nose Sore throat This list may not  describe all possible side effects. Call your doctor for medical advice about side effects. You may report side effects to FDA at 1-800-FDA-1088. Where should I keep my medication? This medication is given in a hospital or clinic. It will not be stored at home. NOTE: This sheet is a summary. It may not cover all possible information. If you have questions about this medicine, talk to your doctor, pharmacist, or health care provider.  2023 Elsevier/Gold Standard (2021-09-29 00:00:00)   Trigger Finger  Trigger finger, also called stenosing tenosynovitis,  is a condition that causes a finger to get stuck in a bent position. Each finger has a tendon, which is a tough, cord-like tissue that connects muscle to bone, and each tendon passes through a tunnel of tissue called a tendon sheath. To move your finger, your tendon needs to glide freely through the sheath. Trigger finger happens when the tendon or the sheath thickens, making it difficult to move your finger. Trigger finger can affect any finger or a thumb. It may affect more than one finger. Mild cases may clear up with rest and medicine. Severe cases require more treatment. What are the causes? Trigger finger is caused by a thickened finger tendon or tendon sheath. The cause of this thickening is not known. What increases the risk? The following factors may make you more likely to develop this condition: Doing activities that require a strong grip. Having rheumatoid arthritis, gout, or diabetes. Being 63-30 years old. Being female. What are the signs or symptoms? Symptoms of this condition include: Pain when bending or straightening your finger. Tenderness or swelling where your finger attaches to the palm of your hand. A lump in the palm of your hand or on the inside of your finger. Hearing a noise like a pop or a snap when you try to straighten your finger. Feeling a catching or locking sensation when you try to straighten your  finger. Being unable to straighten your finger. How is this diagnosed? This condition is diagnosed based on your symptoms and a physical exam. How is this treated? This condition may be treated by: Resting your finger and avoiding activities that make symptoms worse. Wearing a finger splint to keep your finger extended. Taking NSAIDs, such as ibuprofen, to relieve pain and swelling. Doing gentle exercises to stretch the finger as told by your health care provider. Having medicine that reduces swelling and inflammation (steroids) injected into  the tendon sheath. Injections may need to be repeated. Having surgery to open the tendon sheath. This may be done if other treatments do not work and you cannot straighten your finger. You may need physical therapy after surgery. Follow these instructions at home: If you have a splint: Wear the splint as told by your health care provider. Remove it only as told by your health care provider. Loosen it if your fingers tingle, become numb, or turn cold and blue. Keep it clean. If the splint is not waterproof: Do not let it get wet. Cover it with a watertight covering when you take a bath or shower. Managing pain, stiffness, and swelling     If directed, apply heat to the affected area as often as told by your health care provider. Use the heat source that your health care provider recommends, such as a moist heat pack or a heating pad. Place a towel between your skin and the heat source. Leave the heat on for 20-30 minutes. Remove the heat if your skin turns bright red. This is especially important if you are unable to feel pain, heat, or cold. You may have a greater risk of getting burned. If directed, put ice on the painful area. To do this: If you have a removable splint, remove it as told by your health care provider. Put ice in a plastic bag. Place a towel between your skin and the bag or between your splint and the bag. Leave the ice on for 20  minutes, 2-3 times a day.  Activity Rest your finger as told by your health care provider. Avoid activities that make the pain worse. Return to your normal activities as told by your health care provider. Ask your health care provider what activities are safe for you. Do exercises as told by your health care provider. Ask your health care provider when it is safe to drive if you have a splint on your hand. General instructions Take over-the-counter and prescription medicines only as told by your health care provider. Keep all follow-up visits as told by your health care provider. This is important. Contact a health care provider if: Your symptoms are not improving with home care. Summary Trigger finger, also called stenosing tenosynovitis, causes your finger to get stuck in a bent position. This can make it difficult and painful to straighten your finger. This condition develops when a finger tendon or tendon sheath thickens. Treatment may include resting your finger, wearing a splint, and taking medicines. In severe cases, surgery to open the tendon sheath may be needed. This information is not intended to replace advice given to you by your health care provider. Make sure you discuss any questions you have with your health care provider. Document Revised: 10/03/2018 Document Reviewed: 10/03/2018 Elsevier Patient Education  [place].   Osteoporosis  Osteoporosis happens when the bones become thin and less dense than normal. Osteoporosis makes bones more brittle and fragile and more likely to break (fracture). Over time, osteoporosis can cause your bones to become so weak that they fracture after a minor fall. Bones in the hip, wrist, and spine are most likely to fracture due to osteoporosis. What are the causes? The exact cause of this condition is not known. What increases the risk? You are more likely to develop this condition if you: Have family members with this  condition. Have poor nutrition. Use the following: Steroid medicines, such as prednisone. Anti-seizure medicines. Nicotine or tobacco, such as cigarettes, e-cigarettes, and  chewing tobacco. Are female. Are age 79 or older. Are not physically active (are sedentary). Are of European or Asian descent. Have a small body frame. What are the signs or symptoms? A fracture might be the first sign of osteoporosis, especially if the fracture results from a fall or injury that usually would not cause a bone to break. Other signs and symptoms include: Pain in the neck or low back. Stooped posture. Loss of height. How is this diagnosed? This condition may be diagnosed based on: Your medical history. A physical exam. A bone mineral density test, also called a DXA or DEXA test (dual-energy X-ray absorptiometry test). This test uses X-rays to measure the amount of minerals in your bones. How is this treated? This condition may be treated by: Making lifestyle changes, such as: Including foods with more calcium and vitamin D in your diet. Doing weight-bearing and muscle-strengthening exercises. Stopping tobacco use. Limiting alcohol intake. Taking medicine to slow the process of bone loss or to increase bone density. Taking daily supplements of calcium and vitamin D. Taking hormone replacement medicines, such as estrogen for women and testosterone for men. Monitoring your levels of calcium and vitamin D. The goal of treatment is to strengthen your bones and lower your risk for a fracture. Follow these instructions at home: Eating and drinking Include calcium and vitamin D in your diet. Calcium is important for bone health, and vitamin D helps your body absorb calcium. Good sources of calcium and vitamin D include: Certain fatty fish, such as salmon and tuna. Products that have calcium and vitamin D added to them (are fortified), such as fortified cereals. Egg  yolks. Cheese. Liver.  Activity Do exercises as told by your health care provider. Ask your health care provider what exercises and activities are safe for you. You should do: Exercises that make you work against gravity (weight-bearing exercises), such as tai chi, yoga, or walking. Exercises to strengthen muscles, such as lifting weights. Lifestyle Do not drink alcohol if: Your health care provider tells you not to drink. You are pregnant, may be pregnant, or are planning to become pregnant. If you drink alcohol: Limit how much you use to: 0-1 drink a day for women. 0-2 drinks a day for men. Know how much alcohol is in your drink. In the [place] one drink equals one 12 oz bottle of beer (355 mL), one 5 oz glass of wine (148 mL), or one 1 oz glass of hard liquor (44 mL). Do not use any products that contain nicotine or tobacco, such as cigarettes, e-cigarettes, and chewing tobacco. If you need help quitting, ask your health care provider. Preventing falls Use devices to help you move around (mobility aids) as needed, such as canes, walkers, scooters, or crutches. Keep rooms well-lit and clutter-free. Remove tripping hazards from walkways, including cords and throw rugs. Install grab bars in bathrooms and safety rails on stairs. Use rubber mats in the bathroom and other areas that are often wet or slippery. Wear closed-toe shoes that fit well and support your feet. Wear shoes that have rubber soles or low heels. Review your medicines with your health care provider. Some medicines can cause dizziness or changes in blood pressure, which can increase your risk of falling. General instructions Take over-the-counter and prescription medicines only as told by your health care provider. Keep all follow-up visits. This is important. Contact a health care provider if: You have never been screened for osteoporosis and you are: A woman who is age  51 or older. A man who is age 38 or older. Get  help right away if: You fall or injure yourself. Summary Osteoporosis is thinning and loss of density in your bones. This makes bones more brittle and fragile and more likely to break (fracture),even with minor falls. The goal of treatment is to strengthen your bones and lower your risk for a fracture. Include calcium and vitamin D in your diet. Calcium is important for bone health, and vitamin D helps your body absorb calcium. Talk with your health care provider about screening for osteoporosis if you are a woman who is age 59 or older, or a man who is age 50 or older. This information is not intended to replace advice given to you by your health care provider. Make sure you discuss any questions you have with your health care provider. Document Revised: 11/02/2019 Document Reviewed: 11/02/2019 Elsevier Patient Education  [place].

## 2022-03-03 LAB — COMPLETE METABOLIC PANEL WITH GFR
AG Ratio: 1.6 (calc) (ref 1.0–2.5)
ALT: 45 U/L — ABNORMAL HIGH (ref 6–29)
AST: 29 U/L (ref 10–35)
Albumin: 4.7 g/dL (ref 3.6–5.1)
Alkaline phosphatase (APISO): 86 U/L (ref 37–153)
BUN: 9 mg/dL (ref 7–25)
CO2: 28 mmol/L (ref 20–32)
Calcium: 10 mg/dL (ref 8.6–10.4)
Chloride: 102 mmol/L (ref 98–110)
Creat: 0.69 mg/dL (ref 0.60–1.00)
Globulin: 2.9 g/dL (calc) (ref 1.9–3.7)
Glucose, Bld: 111 mg/dL — ABNORMAL HIGH (ref 65–99)
Potassium: 4.3 mmol/L (ref 3.5–5.3)
Sodium: 141 mmol/L (ref 135–146)
Total Bilirubin: 0.3 mg/dL (ref 0.2–1.2)
Total Protein: 7.6 g/dL (ref 6.1–8.1)
eGFR: 90 mL/min/{1.73_m2} (ref >=60)

## 2022-03-03 NOTE — Progress Notes (Signed)
Calcium looks good.  Liver enzyme up some. Avoid alcohol and regular use of tylenol.

## 2022-03-06 ENCOUNTER — Encounter: Payer: Self-pay | Admitting: Physician Assistant

## 2022-03-09 NOTE — Telephone Encounter (Signed)
Submitted PA thru covermymeds on 03/09/22.

## 2022-03-12 ENCOUNTER — Telehealth: Payer: Self-pay | Admitting: Physician Assistant

## 2022-03-12 NOTE — Telephone Encounter (Signed)
Patient wants to know if she can get her Prolia shot while getting her physical as well?

## 2022-03-12 NOTE — Telephone Encounter (Signed)
Yes, please let patient know we can do this during her physical.

## 2022-03-22 ENCOUNTER — Other Ambulatory Visit: Payer: Self-pay | Admitting: Physician Assistant

## 2022-03-25 ENCOUNTER — Encounter: Payer: Self-pay | Admitting: Physician Assistant

## 2022-03-25 ENCOUNTER — Ambulatory Visit (INDEPENDENT_AMBULATORY_CARE_PROVIDER_SITE_OTHER): Payer: Federal, State, Local not specified - PPO | Admitting: Physician Assistant

## 2022-03-25 VITALS — BP 132/86 | HR 106 | Ht 64.0 in | Wt 151.0 lb

## 2022-03-25 DIAGNOSIS — Z0000 Encounter for general adult medical examination without abnormal findings: Secondary | ICD-10-CM

## 2022-03-25 DIAGNOSIS — E039 Hypothyroidism, unspecified: Secondary | ICD-10-CM

## 2022-03-25 DIAGNOSIS — E782 Mixed hyperlipidemia: Secondary | ICD-10-CM

## 2022-03-25 DIAGNOSIS — R748 Abnormal levels of other serum enzymes: Secondary | ICD-10-CM

## 2022-03-25 DIAGNOSIS — M816 Localized osteoporosis [Lequesne]: Secondary | ICD-10-CM

## 2022-03-25 DIAGNOSIS — Z122 Encounter for screening for malignant neoplasm of respiratory organs: Secondary | ICD-10-CM

## 2022-03-25 MED ORDER — DENOSUMAB 60 MG/ML ~~LOC~~ SOSY
60.0000 mg | PREFILLED_SYRINGE | Freq: Once | SUBCUTANEOUS | Status: AC
  Administered 2022-03-25: 60 mg via SUBCUTANEOUS

## 2022-03-25 NOTE — Patient Instructions (Signed)
CT lung cancer screening ordered  Health Maintenance After Age 77 After age 47, you are at a higher risk for certain long-term diseases and infections as well as injuries from falls. Falls are a major cause of broken bones and head injuries in people who are older than age 43. Getting regular preventive care can help to keep you healthy and well. Preventive care includes getting regular testing and making lifestyle changes as recommended by your health care provider. Talk with your health care provider about: Which screenings and tests you should have. A screening is a test that checks for a disease when you have no symptoms. A diet and exercise plan that is right for you. What should I know about screenings and tests to prevent falls? Screening and testing are the best ways to find a health problem early. Early diagnosis and treatment give you the best chance of managing medical conditions that are common after age 62. Certain conditions and lifestyle choices may make you more likely to have a fall. Your health care provider may recommend: Regular vision checks. Poor vision and conditions such as cataracts can make you more likely to have a fall. If you wear glasses, make sure to get your prescription updated if your vision changes. Medicine review. Work with your health care provider to regularly review all of the medicines you are taking, including over-the-counter medicines. Ask your health care provider about any side effects that may make you more likely to have a fall. Tell your health care provider if any medicines that you take make you feel dizzy or sleepy. Strength and balance checks. Your health care provider may recommend certain tests to check your strength and balance while standing, walking, or changing positions. Foot health exam. Foot pain and numbness, as well as not wearing proper footwear, can make you more likely to have a fall. Screenings, including: Osteoporosis screening.  Osteoporosis is a condition that causes the bones to get weaker and break more easily. Blood pressure screening. Blood pressure changes and medicines to control blood pressure can make you feel dizzy. Depression screening. You may be more likely to have a fall if you have a fear of falling, feel depressed, or feel unable to do activities that you used to do. Alcohol use screening. Using too much alcohol can affect your balance and may make you more likely to have a fall. Follow these instructions at home: Lifestyle Do not drink alcohol if: Your health care provider tells you not to drink. If you drink alcohol: Limit how much you have to: 0-1 drink a day for women. 0-2 drinks a day for men. Know how much alcohol is in your drink. In the [place] one drink equals one 12 oz bottle of beer (355 mL), one 5 oz glass of wine (148 mL), or one 1 oz glass of hard liquor (44 mL). Do not use any products that contain nicotine or tobacco. These products include cigarettes, chewing tobacco, and vaping devices, such as e-cigarettes. If you need help quitting, ask your health care provider. Activity  Follow a regular exercise program to stay fit. This will help you maintain your balance. Ask your health care provider what types of exercise are appropriate for you. If you need a cane or walker, use it as recommended by your health care provider. Wear supportive shoes that have nonskid soles. Safety  Remove any tripping hazards, such as rugs, cords, and clutter. Install safety equipment such as grab bars in bathrooms and safety rails  on stairs. Keep rooms and walkways well-lit. General instructions Talk with your health care provider about your risks for falling. Tell your health care provider if: You fall. Be sure to tell your health care provider about all falls, even ones that seem minor. You feel dizzy, tiredness (fatigue), or off-balance. Take over-the-counter and prescription medicines only as told by  your health care provider. These include supplements. Eat a healthy diet and maintain a healthy weight. A healthy diet includes low-fat dairy products, low-fat (lean) meats, and fiber from whole grains, beans, and lots of fruits and vegetables. Stay current with your vaccines. Schedule regular health, dental, and eye exams. Summary Having a healthy lifestyle and getting preventive care can help to protect your health and wellness after age 77. Screening and testing are the best way to find a health problem early and help you avoid having a fall. Early diagnosis and treatment give you the best chance for managing medical conditions that are more common for people who are older than age 25. Falls are a major cause of broken bones and head injuries in people who are older than age 61. Take precautions to prevent a fall at home. Work with your health care provider to learn what changes you can make to improve your health and wellness and to prevent falls. This information is not intended to replace advice given to you by your health care provider. Make sure you discuss any questions you have with your health care provider. Document Revised: 10/07/2020 Document Reviewed: 10/07/2020 Elsevier Patient Education  [employer].

## 2022-03-25 NOTE — Progress Notes (Signed)
Complete physical exam  Patient: Gabriela Baker   DOB: [birth year]   76 y.o. Female  MRN: [id number]  Subjective:    Chief Complaint  Patient presents with   Follow-up    Gabriela Baker is a 76 y.o. female who presents today for a complete physical exam. She reports consuming a general diet. The patient does not participate in regular exercise at present. She generally feels well. She reports sleeping well. She does not have additional problems to discuss today.    Most recent fall risk assessment:    03/25/2022   10:37 AM  Fall Risk   Falls in the past year? 0  Number falls in past yr: 0  Injury with Fall? 0  Risk for fall due to : No Fall Risks  Follow up Falls evaluation completed     Most recent depression screenings:    03/25/2022   10:37 AM 03/02/2022   10:58 AM  PHQ 2/9 Scores  PHQ - 2 Score 0 0    Vision:Not within last year  and Dental: No current dental problems and Receives regular dental care  Patient Active Problem List   Diagnosis Date Noted   Trigger finger, right ring finger 03/02/2022   Osteoporosis 02/25/2022   Muscle strain 02/23/2022   DDD (degenerative disc disease), lumbar 06/27/2021   Acute right-sided low back pain without sciatica 06/27/2021   Bilateral thumb pain 07/24/2020   Primary osteoarthritis of first carpometacarpal joint of left hand 04/24/2020   Elevated liver enzymes 11/22/2019   Combined forms of age-related cataract of left eye 06/20/2019   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   Former smoker 07/18/2018   Tachycardia 07/18/2018   Chronic obstructive pulmonary disease ([place]) 06/25/2016   Aortic atherosclerosis ([place]) 06/25/2016   Hypertriglyceridemia 10/08/2015   Atypical nevus 10/04/2015   History of anaphylaxis 04/15/2015   Thyroid activity decreased 04/15/2015   Hyperlipidemia 04/15/2015   Osteopenia 04/15/2015   Post-menopausal 04/15/2015   Past Medical History:  Diagnosis Date    Hyperlipidemia    Thyroid disease    Family History  Problem Relation Age of Onset   Cancer Mother    Heart failure Father    Cancer Father    Allergies  Allergen Reactions   Bee Venom Other (See Comments)      Patient Care Team: Lavada Mesi as PCP - General (Family Medicine)   Outpatient Medications Prior to Visit  Medication Sig   albuterol (VENTOLIN HFA) 108 (90 Base) MCG/ACT inhaler TAKE 2 PUFFS BY MOUTH EVERY 6 HOURS AS NEEDED FOR WHEEZE OR SHORTNESS OF BREATH   aspirin EC 81 MG tablet Take 81 mg by mouth daily.   atorvastatin (LIPITOR) 40 MG tablet Take 1 tablet (40 mg total) by mouth daily.   Calcium Citrate-Vitamin D (CALCIUM + D PO) Take by mouth daily.   diclofenac Sodium (VOLTAREN) 1 % GEL Apply 4 g topically 4 (four) times daily. To affected joint.   levothyroxine (SYNTHROID) 75 MCG tablet Take 1 tablet (75 mcg total) by mouth daily. Need labs for refills   Multiple Vitamins-Minerals (CENTRUM SILVER PO) Take by mouth daily.   umeclidinium-vilanterol (ANORO ELLIPTA) 62.5-25 MCG/ACT AEPB Inhale 1 puff into the lungs daily at 6 (six) AM.   [DISCONTINUED] alendronate (FOSAMAX) 70 MG tablet Take 1 tablet (70 mg total) by mouth every 7 (seven) days.   [DISCONTINUED] cyclobenzaprine (FLEXERIL) 5 MG tablet Take 1 tablet (5 mg total) by mouth 3 (three) times daily as  needed for muscle spasms.   [DISCONTINUED] predniSONE (DELTASONE) 50 MG tablet Take one tablet for 5 days.   No facility-administered medications prior to visit.    ROS   See HPI.      Objective:     BP 132/86   Pulse (!) 106   Ht '5\' 4"'$  (1.626 m)   Wt 151 lb (68.5 kg)   SpO2 98%   BMI 25.92 kg/m  BP Readings from Last 3 Encounters:  03/25/22 132/86  03/02/22 121/79  02/23/22 114/70   Wt Readings from Last 3 Encounters:  03/25/22 151 lb (68.5 kg)  03/02/22 155 lb 1.3 oz (70.3 kg)  02/23/22 156 lb (70.8 kg)      Physical Exam Constitutional:      Appearance: Normal appearance.   HENT:     Head: Normocephalic.     Right Ear: Tympanic membrane normal.     Left Ear: Tympanic membrane normal.     Nose: Nose normal.     Mouth/Throat:     Mouth: Mucous membranes are moist.     Comments: Poor dentition.  Neck:     Vascular: No carotid bruit.  Cardiovascular:     Rate and Rhythm: Normal rate and regular rhythm.  Pulmonary:     Effort: Pulmonary effort is normal.     Breath sounds: Normal breath sounds.  Musculoskeletal:     Right lower leg: No edema.     Left lower leg: No edema.  Lymphadenopathy:     Cervical: No cervical adenopathy.  Neurological:     General: No focal deficit present.     Mental Status: She is alert and oriented to person, place, and time.  Psychiatric:        Mood and Affect: Mood normal.            Assessment & Plan:    Routine Health Maintenance and Physical Exam  Immunization History  Administered Date(s) Administered   COVID-19, mRNA, vaccine(Comirnaty)12 years and older 03/09/2022   Fluad Quad(high Dose 65+) 03/07/2019, 03/29/2020, 02/23/2022   Influenza, High Dose Seasonal PF 05/05/2016, 03/05/2017, 03/10/2019   Influenza,inj,Quad PF,6+ Mos 04/15/2015, 02/28/2018   Influenza-Unspecified 03/21/2021   Moderna SARS-COV2 Booster Vaccination 03/21/2021   Moderna Sars-Covid-2 Vaccination 09/22/2019, 10/20/2019, 04/15/2020   Pneumococcal Conjugate-13 04/15/2015   Pneumococcal Polysaccharide-23 03/05/2017   Tdap 06/12/2013   Zoster Recombinat (Shingrix) 06/27/2020, 08/27/2020    Health Maintenance  Topic Date Due   Lung Cancer Screening  06/25/2017   Medicare Annual Wellness (AWV)  04/28/2021   MAMMOGRAM  04/01/2022 (Originally 01/05/2012)   COVID-19 Vaccine (4 - Moderna risk series) 07/02/2022 (Originally 05/16/2021)   TETANUS/TDAP  06/13/2023   Pneumonia Vaccine 12+ Years old  Completed   INFLUENZA VACCINE  Completed   DEXA SCAN  Completed   Hepatitis C Screening  Completed   Zoster Vaccines- Shingrix  Completed    HPV VACCINES  Aged Out   Fecal DNA (Cologuard)  Discontinued    Discussed health benefits of physical activity, and encouraged her to engage in regular exercise appropriate for her age and condition.  Marland KitchenVaughan Baker was seen today for follow-up.  Diagnoses and all orders for this visit:  Annual physical exam -     TSH -     Lipid Panel w/reflex Direct LDL -     COMPLETE METABOLIC PANEL WITH GFR -     CBC with Differential/Platelet  Hypothyroidism, unspecified type -     TSH  Mixed hyperlipidemia -  Lipid Panel w/reflex Direct LDL  Elevated liver enzymes -     COMPLETE METABOLIC PANEL WITH GFR  Localized osteoporosis without current pathological fracture -     denosumab (PROLIA) injection 60 mg -     Vitamin D (25 hydroxy)  Encounter for screening for lung cancer -     Ambulatory Referral for Lung Cancer Scre   .[name] Discussed 150 minutes of exercise a week.  Encouraged vitamin D 1000 units and Calcium '1300mg'$  or 4 servings of dairy a day.  Fasting labs ordered today PHQ no concerns Bone density UTD- continue prolia Mammogram declined-pt aware of risk Needs lung cancer screening Cologuard 2021. No need for pap UTD flu/covid/shingles vaccine Needs penumonia 20.      Iran Planas, PA-C

## 2022-03-26 LAB — COMPLETE METABOLIC PANEL WITH GFR
AG Ratio: 1.6 (calc) (ref 1.0–2.5)
ALT: 49 U/L — ABNORMAL HIGH (ref 6–29)
AST: 39 U/L — ABNORMAL HIGH (ref 10–35)
Albumin: 4.6 g/dL (ref 3.6–5.1)
Alkaline phosphatase (APISO): 85 U/L (ref 37–153)
BUN: 8 mg/dL (ref 7–25)
CO2: 29 mmol/L (ref 20–32)
Calcium: 10 mg/dL (ref 8.6–10.4)
Chloride: 104 mmol/L (ref 98–110)
Creat: 0.71 mg/dL (ref 0.60–1.00)
Globulin: 2.8 g/dL (calc) (ref 1.9–3.7)
Glucose, Bld: 95 mg/dL (ref 65–99)
Potassium: 4.7 mmol/L (ref 3.5–5.3)
Sodium: 142 mmol/L (ref 135–146)
Total Bilirubin: 0.5 mg/dL (ref 0.2–1.2)
Total Protein: 7.4 g/dL (ref 6.1–8.1)
eGFR: 88 mL/min/{1.73_m2} (ref >=60)

## 2022-03-26 LAB — LIPID PANEL W/REFLEX DIRECT LDL
Cholesterol: 192 mg/dL (ref <200)
HDL: 83 mg/dL (ref >=50)
LDL Cholesterol (Calc): 87 mg/dL (calc)
Non-HDL Cholesterol (Calc): 109 mg/dL (calc) (ref <130)
Total CHOL/HDL Ratio: 2.3 (calc) (ref <5.0)
Triglycerides: 121 mg/dL (ref <150)

## 2022-03-26 LAB — CBC WITH DIFFERENTIAL/PLATELET
Absolute Monocytes: 546 cells/uL (ref 200–950)
Basophils Absolute: 62 cells/uL (ref 0–200)
Basophils Relative: 1 %
Eosinophils Absolute: 391 cells/uL (ref 15–500)
Eosinophils Relative: 6.3 %
HCT: 39.5 % (ref 35.0–45.0)
Hemoglobin: 13.5 g/dL (ref 11.7–15.5)
Lymphs Abs: 2282 cells/uL (ref 850–3900)
MCH: 32.6 pg (ref 27.0–33.0)
MCHC: 34.2 g/dL (ref 32.0–36.0)
MCV: 95.4 fL (ref 80.0–100.0)
MPV: 11.1 fL (ref 7.5–12.5)
Monocytes Relative: 8.8 %
Neutro Abs: 2920 cells/uL (ref 1500–7800)
Neutrophils Relative %: 47.1 %
Platelets: 264 10*3/uL (ref 140–400)
RBC: 4.14 10*6/uL (ref 3.80–5.10)
RDW: 12.4 % (ref 11.0–15.0)
Total Lymphocyte: 36.8 %
WBC: 6.2 10*3/uL (ref 3.8–10.8)

## 2022-03-26 LAB — TSH: TSH: 0.45 mIU/L (ref 0.40–4.50)

## 2022-03-26 LAB — VITAMIN D 25 HYDROXY (VIT D DEFICIENCY, FRACTURES): Vit D, 25-Hydroxy: 82 ng/mL (ref 30–100)

## 2022-03-27 ENCOUNTER — Other Ambulatory Visit: Payer: Self-pay | Admitting: Physician Assistant

## 2022-03-27 ENCOUNTER — Encounter: Payer: Self-pay | Admitting: Physician Assistant

## 2022-03-27 DIAGNOSIS — J449 Chronic obstructive pulmonary disease, unspecified: Secondary | ICD-10-CM

## 2022-03-27 MED ORDER — LEVOTHYROXINE SODIUM 75 MCG PO TABS: 75.0000 ug | ORAL_TABLET | Freq: Every day | ORAL | 2 refills | Status: DC

## 2022-03-27 MED ORDER — UMECLIDINIUM-VILANTEROL 62.5-25 MCG/ACT IN AEPB: 1.0000 | INHALATION_SPRAY | Freq: Every day | RESPIRATORY_TRACT | 11 refills | Status: DC

## 2022-03-27 MED ORDER — ATORVASTATIN CALCIUM 40 MG PO TABS
40.0000 mg | ORAL_TABLET | Freq: Every day | ORAL | 3 refills | Status: DC
Start: 2022-03-27 — End: 2023-03-24

## 2022-03-27 NOTE — Progress Notes (Signed)
Vitamin D looks GREAT.  Cholesterol looks fantastic.  Thyroid looks good.  AST/ALT up some. Avoid tylenol and alcohol. Recheck in 3 months.

## 2022-03-30 ENCOUNTER — Ambulatory Visit: Payer: Federal, State, Local not specified - PPO

## 2022-09-15 ENCOUNTER — Telehealth: Payer: Self-pay

## 2022-09-15 NOTE — Telephone Encounter (Signed)
Initiated Prior authorization ZOX:[id number] /ML syringes Via: Called [facility] fep number  Case/Key:n/a Status: approved as of 08/1622 Reason:pt already has prior auth on file no pa is need at this time Berkley Harvey is good until 03/09/2023 Notified Pt via: Mychart

## 2022-09-16 ENCOUNTER — Other Ambulatory Visit: Payer: Self-pay

## 2022-09-16 DIAGNOSIS — M810 Age-related osteoporosis without current pathological fracture: Secondary | ICD-10-CM

## 2022-09-16 NOTE — Progress Notes (Signed)
Labs for Prolia injection.

## 2022-09-22 LAB — COMPLETE METABOLIC PANEL WITH GFR
AG Ratio: 1.9 (calc) (ref 1.0–2.5)
ALT: 17 U/L (ref 6–29)
AST: 17 U/L (ref 10–35)
Albumin: 4.4 g/dL (ref 3.6–5.1)
Alkaline phosphatase (APISO): 56 U/L (ref 37–153)
BUN: 12 mg/dL (ref 7–25)
CO2: 32 mmol/L (ref 20–32)
Calcium: 9.9 mg/dL (ref 8.6–10.4)
Chloride: 105 mmol/L (ref 98–110)
Creat: 0.67 mg/dL (ref 0.60–1.00)
Globulin: 2.3 g/dL (calc) (ref 1.9–3.7)
Glucose, Bld: 89 mg/dL (ref 65–99)
Potassium: 4.5 mmol/L (ref 3.5–5.3)
Sodium: 144 mmol/L (ref 135–146)
Total Bilirubin: 0.6 mg/dL (ref 0.2–1.2)
Total Protein: 6.7 g/dL (ref 6.1–8.1)
eGFR: 90 mL/min/{1.73_m2} (ref >=60)

## 2022-09-22 NOTE — Progress Notes (Signed)
Labs look good. Proceed with prolia.

## 2022-09-29 ENCOUNTER — Ambulatory Visit (INDEPENDENT_AMBULATORY_CARE_PROVIDER_SITE_OTHER): Payer: Federal, State, Local not specified - PPO | Admitting: Family Medicine

## 2022-09-29 VITALS — BP 108/67 | HR 56 | Ht 64.0 in | Wt 151.1 lb

## 2022-09-29 DIAGNOSIS — M810 Age-related osteoporosis without current pathological fracture: Secondary | ICD-10-CM

## 2022-09-29 MED ORDER — DENOSUMAB 60 MG/ML ~~LOC~~ SOSY
60.0000 mg | PREFILLED_SYRINGE | Freq: Once | SUBCUTANEOUS | Status: AC
  Administered 2022-09-29: 60 mg via SUBCUTANEOUS

## 2022-09-29 NOTE — Progress Notes (Signed)
   Subjective:    Patient ID: Gabriela Baker, female    DOB: [birth year], 77 y.o.   MRN: [id number]  HPI Pt is here for a Prolia injection. Patient reports taking calcium and vitamin D daily.    Review of Systems     Objective:   Physical Exam        Assessment & Plan:  Pt tolerated injection well. Pt advised to schedule injection in 6 months.

## 2022-09-29 NOTE — Progress Notes (Signed)
Agree with documentation as above.   Crystel Demarco, MD  

## 2022-10-02 ENCOUNTER — Encounter: Payer: Self-pay | Admitting: *Deleted

## 2022-12-15 ENCOUNTER — Encounter: Payer: Self-pay | Admitting: Physician Assistant

## 2022-12-15 ENCOUNTER — Ambulatory Visit: Payer: Federal, State, Local not specified - PPO | Admitting: Physician Assistant

## 2022-12-15 VITALS — BP 114/72 | HR 71 | Resp 16 | Ht 64.0 in | Wt 149.1 lb

## 2022-12-15 DIAGNOSIS — L209 Atopic dermatitis, unspecified: Secondary | ICD-10-CM | POA: Diagnosis not present

## 2022-12-15 MED ORDER — TRIAMCINOLONE ACETONIDE 0.1 % EX CREA: 1.0000 | TOPICAL_CREAM | Freq: Two times a day (BID) | CUTANEOUS | 0 refills | Status: AC

## 2022-12-15 NOTE — Patient Instructions (Signed)
Atopic Dermatitis ?Atopic dermatitis is a skin disorder that causes inflammation of the skin. It is marked by a red rash and itchy, dry, scaly skin. It is the most common type of eczema. Eczema is a group of skin conditions that cause the skin to become rough and swollen. This condition is generally worse during the cooler winter months and often improves during the warm summer months. ?Atopic dermatitis usually starts showing signs in infancy and can last through adulthood. This condition cannot be passed from one person to another (is not contagious). Atopic dermatitis may not always be present, but when it is, it is called a flare-up. ?What are the causes? ?The exact cause of this condition is not known. Flare-ups may be triggered by: ?Coming in contact with something that you are sensitive or allergic to (allergen). ?Stress. ?Certain foods. ?Extremely hot or cold weather. ?Harsh chemicals and soaps. ?Dry air. ?Chlorine. ?What increases the risk? ?This condition is more likely to develop in people who have a personal or family history of: ?Eczema. ?Allergies. ?Asthma. ?Hay fever. ?What are the signs or symptoms? ?Symptoms of this condition include: ?Dry, scaly skin. ?Red, itchy rash. ?Itchiness, which can be severe. This may occur before the skin rash. This can make sleeping difficult. ?Skin thickening and cracking that can occur over time. ?How is this diagnosed? ?This condition is diagnosed based on: ?Your symptoms. ?Your medical history. ?A physical exam. ?How is this treated? ?There is no cure for this condition, but symptoms can usually be controlled. Treatment focuses on: ?Controlling the itchiness and scratching. You may be given medicines, such as antihistamines or steroid creams. ?Limiting exposure to allergens. ?Recognizing situations that cause stress and developing a plan to manage stress. ?If your atopic dermatitis does not get better with medicines, or if it is all over your body (widespread), a  treatment using a specific type of light (phototherapy) may be used. ?Follow these instructions at home: ?Skin care ? ?Keep your skin well moisturized. Doing this seals in moisture and helps to prevent dryness. ?Use unscented lotions that have petroleum in them. ?Avoid lotions that contain alcohol or water. They can dry the skin. ?Keep baths or showers short (less than 5 minutes) in warm water. Do not use hot water. ?Use mild, unscented cleansers for bathing. Avoid soap and bubble bath. ?Apply a moisturizer to your skin right after a bath or shower. ?Do not apply anything to your skin without checking with your health care provider. ?General instructions ?Take or apply over-the-counter and prescription medicines only as told by your health care provider. ?Dress in clothes made of cotton or cotton blends. Dress lightly because heat increases itchiness. ?When washing your clothes, rinse your clothes twice so all of the soap is removed. ?Avoid any triggers that can cause a flare-up. ?Keep your fingernails cut short. ?Avoid scratching. Scratching makes the rash and itchiness worse. A break in the skin from scratching could result in a skin infection (impetigo). ?Do not be around people who have cold sores or fever blisters. If you get the infection, it may cause your atopic dermatitis to worsen. ?Keep all follow-up visits. This is important. ?Contact a health care provider if: ?Your itchiness interferes with sleep. ?Your rash gets worse or is not better within one week of starting treatment. ?You have a fever. ?You have a rash flare-up after having contact with someone who has cold sores or fever blisters. ?Get help right away if: ?You develop pus or soft yellow scabs in the rash   area. ?Summary ?Atopic dermatitis causes a red rash and itchy, dry, scaly skin. ?Treatment focuses on controlling the itchiness and scratching, limiting exposure to things that you are sensitive or allergic to (allergens), recognizing  situations that cause stress, and developing a plan to manage stress. ?Keep your skin well moisturized. ?Keep baths or showers shorter than 5 minutes and use warm water. Do not use hot water. ?This information is not intended to replace advice given to you by your health care provider. Make sure you discuss any questions you have with your health care provider. ?Document Revised: 02/26/2020 Document Reviewed: 02/26/2020 ?Elsevier Patient Education ? 2023 Elsevier Inc. ? ?

## 2022-12-15 NOTE — Progress Notes (Signed)
   Acute Office Visit  Subjective:     Patient ID: Gabriela Baker, female    DOB: 01-09-1946, 77 y.o.   MRN: [id number]  Chief Complaint  Patient presents with   lump on back of neck    Pt states area is itchy has a few little bumps area is pink in color    HPI Patient is in today for itchy scaly red rash on upper back left neck. Noticed for a few days. Seems to be getting itchier. Using OtC lotions without improvement.   .. 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   Primary osteoarthritis of first carpometacarpal joint of left hand 04/24/2020   Bilateral thumb pain 07/24/2020   DDD (degenerative disc disease), lumbar 06/27/2021   Acute right-sided low back pain without sciatica 06/27/2021   Muscle strain 02/23/2022   Osteoporosis 02/25/2022   Trigger finger, right ring finger 03/02/2022   Atopic dermatitis, mild 12/15/2022   Resolved Ambulatory Problems    Diagnosis Date Noted   Tobacco dependence 04/15/2015   Tobacco use disorder 06/25/2016   History of smoking greater than 50 pack years 06/25/2016   Past Medical History:  Diagnosis Date   Thyroid disease      ROS See HPI.      Objective:    BP 114/72 (BP Location: Right Arm, Patient Position: Sitting, Cuff Size: Normal)   Pulse 71   Resp 16   Ht 5\' 4"  (1.626 m)   Wt 149 lb 1.3 oz (67.6 kg)   SpO2 96%   BMI 25.59 kg/m  BP Readings from Last 3 Encounters:  12/15/22  114/72  09/29/22 108/67  03/25/22 132/86   Wt Readings from Last 3 Encounters:  12/15/22 149 lb 1.3 oz (67.6 kg)  09/29/22 151 lb 1.9 oz (68.5 kg)  03/25/22 151 lb (68.5 kg)      Physical Exam Constitutional:      Appearance: Normal appearance.  Cardiovascular:     Rate and Rhythm: Normal rate.  Skin:    Comments: 1cm by 1cm macular erythema with fine scales on left upper neck.   Neurological:     Mental Status: She is alert and oriented to person, place, and time.  Psychiatric:        Mood and Affect: Mood normal.          Assessment & Plan:  Marland KitchenMarland KitchenNeya was seen today for lump on back of neck.  Diagnoses and all orders for this visit:  Atopic dermatitis, mild -     triamcinolone cream (KENALOG) 0.1 %; Apply 1 Application topically 2 (two) times daily.   Use topical steroid as needed bid until rash goes away, then as needed HO given  Return if symptoms worsen or fail to improve.  Tandy Gaw, PA-C

## 2022-12-25 ENCOUNTER — Ambulatory Visit
Admission: EM | Admit: 2022-12-25 | Discharge: 2022-12-25 | Disposition: A | Discharge status: Home / Self Care | Payer: Federal, State, Local not specified - PPO | Source: Home / Self Care

## 2022-12-25 DIAGNOSIS — R52 Pain, unspecified: Secondary | ICD-10-CM | POA: Diagnosis present

## 2022-12-25 DIAGNOSIS — Z1152 Encounter for screening for COVID-19: Secondary | ICD-10-CM | POA: Diagnosis not present

## 2022-12-25 DIAGNOSIS — R059 Cough, unspecified: Secondary | ICD-10-CM | POA: Diagnosis not present

## 2022-12-25 DIAGNOSIS — R519 Headache, unspecified: Secondary | ICD-10-CM | POA: Diagnosis not present

## 2022-12-25 MED ORDER — BENZONATATE 200 MG PO CAPS: 200.0000 mg | ORAL_CAPSULE | Freq: Three times a day (TID) | ORAL | 0 refills | Status: AC | PRN

## 2022-12-25 NOTE — ED Provider Notes (Signed)
[facility]    CSN: [id number] Arrival date & time: 12/25/22  1930      History   Chief Complaint Chief Complaint  Patient presents with   Generalized Body Aches    HPI Gabriela Baker is a 77 y.o. female.   HPI 77 year old female presents with headache and body ache for 5 days.  PMH significant for COPD, aortic atherosclerosis and lumbar degenerative disc disease.  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 No pertinent surgical history.  OB History   No obstetric history on file.      Home Medications    Prior to Admission medications   Medication Sig Start Date End Date Taking? Authorizing Provider  benzonatate (TESSALON) 200 MG capsule Take 1 capsule (200 mg total) by mouth 3 (three) times daily as needed for up to 7 days. 12/25/22 01/01/23 Yes Trevor Iha, FNP  albuterol (VENTOLIN HFA) 108 (90 Base) MCG/ACT inhaler TAKE 2 PUFFS BY MOUTH EVERY 6 HOURS AS  NEEDED FOR WHEEZE OR SHORTNESS OF BREATH 10/30/21   Breeback, Jade L, PA-C  aspirin EC 81 MG tablet Take 81 mg by mouth daily.    [provider]  atorvastatin (LIPITOR) 40 MG tablet Take 1 tablet (40 mg total) by mouth daily. 03/27/22   Breeback, Lesly Rubenstein L, PA-C  Calcium Citrate-Vitamin D (CALCIUM + D PO) Take by mouth daily.    [provider]  diclofenac Sodium (VOLTAREN) 1 % GEL Apply 4 g topically 4 (four) times daily. To affected joint. 04/23/20   Breeback, Lonna Cobb, PA-C  levothyroxine (SYNTHROID) 75 MCG tablet Take 1 tablet (75 mcg total) by mouth daily. 06/20/22   Breeback, Lonna Cobb, PA-C  Multiple Vitamins-Minerals (CENTRUM SILVER PO) Take by mouth daily.    [provider]  triamcinolone cream (KENALOG) 0.1 % Apply 1 Application topically 2 (two) times daily. 12/15/22   Breeback, Lonna Cobb, PA-C  umeclidinium-vilanterol (ANORO ELLIPTA) 62.5-25 MCG/ACT AEPB Inhale 1 puff into the lungs daily at 6 (six) AM. 03/27/22   Jomarie Longs, PA-C    Family History Family History  Problem Relation Age of Onset   Cancer Mother    Heart failure Father    Cancer Father     Social History Social History   Tobacco Use   Smoking status: Former    Current packs/day: 0.00    Average packs/day: 1 pack/day for 54.0 years (54.0 ttl pk-yrs)    Types: Cigarettes    Start date: 09/01/1965    Quit date: 09/02/2019  Years since quitting: 3.3   Smokeless tobacco: Never  Vaping Use   Vaping status: Never Used  Substance Use Topics   Alcohol use: Yes    Alcohol/week: 3.0 standard drinks of alcohol    Types: 3 Glasses of wine per week    Comment: 21 glasses a week   Drug use: No     Allergies   Bee venom   Review of Systems Review of Systems  Musculoskeletal:  Positive for arthralgias and myalgias.  Neurological:  Positive for headaches.  All other systems reviewed and are negative.    Physical Exam Triage Vital Signs ED Triage Vitals  Encounter Vitals Group     BP       Systolic BP Percentile      Diastolic BP Percentile      Pulse      Resp      Temp      Temp src      SpO2      Weight      Height      Head Circumference      Peak Flow      Pain Score      Pain Loc      Pain Education      Exclude from Growth Chart    No data found.  Updated Vital Signs BP 136/87   Pulse (!) 132   Temp 99.3 F (37.4 C)   Resp 16   SpO2 94%     Physical Exam Vitals and nursing note reviewed.  Constitutional:      General: She is not in acute distress.    Appearance: Normal appearance. She is normal weight. She is not ill-appearing.  HENT:     Head: Normocephalic and atraumatic.     Right Ear: Tympanic membrane, ear canal and external ear normal.     Left Ear: Tympanic membrane, ear canal and external ear normal.     Mouth/Throat:     Mouth: Mucous membranes are moist.     Pharynx: Oropharynx is clear.  Eyes:     Extraocular Movements: Extraocular movements intact.     Conjunctiva/sclera: Conjunctivae normal.     Pupils: Pupils are equal, round, and reactive to light.  Cardiovascular:     Rate and Rhythm: Regular rhythm. Tachycardia present.     Pulses: Normal pulses.     Heart sounds: Normal heart sounds.  Pulmonary:     Effort: Pulmonary effort is normal.     Breath sounds: Normal breath sounds. No wheezing, rhonchi or rales.  Musculoskeletal:        General: Normal range of motion.     Cervical back: Normal range of motion and neck supple.  Skin:    General: Skin is warm and dry.  Neurological:     General: No focal deficit present.     Mental Status: She is alert and oriented to person, place, and time. Mental status is at baseline.  Psychiatric:        Mood and Affect: Mood normal.        Behavior: Behavior normal.        Thought Content: Thought content normal.      UC Treatments / Results  Labs (all labs ordered are listed, but only abnormal results are displayed) Labs Reviewed  SARS CORONAVIRUS 2 (TAT 6-24 HRS)     EKG   Radiology No results found.  Procedures Procedures (including critical care time)  Medications Ordered in UC Medications -  No data to display  Initial Impression / Assessment and Plan / UC Course  I have reviewed the triage vital signs and the nursing notes.  Pertinent labs & imaging results that were available during my care of the patient were reviewed by me and considered in my medical decision making (see chart for details).     MDM: 1.  Body aches-SARS coronavirus 2 (TAT 6-24); 2. Bad headache-advised may take OTC Tylenol 1000 mg daily, as needed for headache: 3.  Cough, unspecified type-Rx'd Tessalon Perles 200 mg 3 times daily, as needed. Advised patient may take OTC Tylenol 1000 mg for headache daily, as needed.  Encouraged increase daily water intake while taking this medication.  Advised may take Tessalon Perles daily or as needed for cough.  Advised we will follow-up with COVID 19 test results once received.  Advised patient if symptoms worsen and/or unresolved please follow-up with PCP or here for further evaluation.   Final Clinical Impressions(s) / UC Diagnoses   Final diagnoses:  Body aches  Bad headache  Cough, unspecified type     Discharge Instructions      Advised patient may take OTC Tylenol 1000 mg for headache daily, as needed.  Encouraged increase daily water intake while taking this medication.  Advised may take Tessalon Perles daily or as needed for cough.  Advised we will follow-up with COVID 19 test results once received.  Advised patient if symptoms worsen and/or unresolved please follow-up with PCP or here for further evaluation.     ED Prescriptions     Medication Sig Dispense Auth. Provider   benzonatate (TESSALON) 200 MG capsule Take 1 capsule (200 mg total) by mouth 3 (three) times daily as needed for up to 7 days. 40 capsule Trevor Iha, FNP      PDMP not reviewed this encounter.   Trevor Iha, FNP 12/25/22 1954

## 2022-12-25 NOTE — Discharge Instructions (Addendum)
Advised patient may take OTC Tylenol 1000 mg for headache daily, as needed.  Encouraged increase daily water intake while taking this medication.  Advised may take Tessalon Perles daily or as needed for cough.  Advised we will follow-up with COVID 19 test results once received.  Advised patient if symptoms worsen and/or unresolved please follow-up with PCP or here for further evaluation.

## 2022-12-25 NOTE — ED Triage Notes (Signed)
Pt presents to uc with co of ha, body aches, congestion and sore throat for 3 days. Has been taking tylenol and motrin. Pt reports 2 neg at home  covid test but her granddaughter was recently covid pos.

## 2023-03-24 ENCOUNTER — Ambulatory Visit: Payer: Federal, State, Local not specified - PPO | Admitting: Physician Assistant

## 2023-03-24 ENCOUNTER — Ambulatory Visit: Payer: Federal, State, Local not specified - PPO

## 2023-03-24 VITALS — BP 136/70 | HR 79 | Ht 62.0 in | Wt 152.0 lb

## 2023-03-24 DIAGNOSIS — Z13220 Encounter for screening for lipoid disorders: Secondary | ICD-10-CM

## 2023-03-24 DIAGNOSIS — M79605 Pain in left leg: Secondary | ICD-10-CM

## 2023-03-24 DIAGNOSIS — E039 Hypothyroidism, unspecified: Secondary | ICD-10-CM

## 2023-03-24 DIAGNOSIS — Z780 Asymptomatic menopausal state: Secondary | ICD-10-CM

## 2023-03-24 DIAGNOSIS — Z79899 Other long term (current) drug therapy: Secondary | ICD-10-CM | POA: Diagnosis not present

## 2023-03-24 DIAGNOSIS — M869 Osteomyelitis, unspecified: Secondary | ICD-10-CM | POA: Diagnosis not present

## 2023-03-24 DIAGNOSIS — Z131 Encounter for screening for diabetes mellitus: Secondary | ICD-10-CM

## 2023-03-24 DIAGNOSIS — J411 Mucopurulent chronic bronchitis: Secondary | ICD-10-CM

## 2023-03-24 DIAGNOSIS — M810 Age-related osteoporosis without current pathological fracture: Secondary | ICD-10-CM | POA: Diagnosis not present

## 2023-03-24 DIAGNOSIS — J449 Chronic obstructive pulmonary disease, unspecified: Secondary | ICD-10-CM

## 2023-03-24 DIAGNOSIS — M25552 Pain in left hip: Secondary | ICD-10-CM

## 2023-03-24 DIAGNOSIS — Z23 Encounter for immunization: Secondary | ICD-10-CM | POA: Diagnosis not present

## 2023-03-24 DIAGNOSIS — E782 Mixed hyperlipidemia: Secondary | ICD-10-CM

## 2023-03-24 DIAGNOSIS — I700 Atherosclerosis of aorta: Secondary | ICD-10-CM

## 2023-03-24 MED ORDER — ATORVASTATIN CALCIUM 40 MG PO TABS: 40.0000 mg | ORAL_TABLET | Freq: Every day | ORAL | 3 refills | Status: DC

## 2023-03-24 MED ORDER — UMECLIDINIUM-VILANTEROL 62.5-25 MCG/ACT IN AEPB: 1.0000 | INHALATION_SPRAY | Freq: Every day | RESPIRATORY_TRACT | 11 refills | Status: AC

## 2023-03-24 NOTE — Progress Notes (Signed)
GREAT news! Negative for any blood clots! We still what xray shows.

## 2023-03-24 NOTE — Progress Notes (Signed)
Established Patient Office Visit  Subjective   Patient ID: Gabriela Baker, female    DOB: [birth year]  Age: 77 y.o. MRN: [id number]  Chief Complaint  Patient presents with   Medical Management of Chronic Issues    Med consult bone denisty    HPI Pt is a 77 yo female with COPD, Osteoporosis, hyperlipidemia who presents to the clinic for follow up.   She wants to make sure prolia is best option for her. She has no concerns or complaints except she has to come in another time to get shot.   She is doing well with breathing. No exacerbations in a long while.   She is having some left lateral thigh pain for last month or so. Seems like "deep pain". No SOB, warmth, swelling. Does not seem to radiate or go up into back.   .. 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   Primary osteoarthritis of first carpometacarpal joint of left hand 04/24/2020   Bilateral thumb pain 07/24/2020   DDD (degenerative disc disease), lumbar 06/27/2021   Acute right-sided low back pain without sciatica 06/27/2021   Muscle strain 02/23/2022   Osteoporosis 02/25/2022   Trigger finger, right ring finger 03/02/2022   Atopic dermatitis, mild 12/15/2022   Resolved Ambulatory Problems    Diagnosis Date Noted   Tobacco dependence 04/15/2015   Tobacco use disorder 06/25/2016   History of smoking greater than 50 pack years 06/25/2016   Past Medical History:  Diagnosis Date    Thyroid disease      ROS See HPI.    Objective:     BP 136/70   Pulse 79   Ht 5\' 2"  (1.575 m)   Wt 152 lb (68.9 kg)   SpO2 99%   BMI 27.80 kg/m  BP Readings from Last 3 Encounters:  03/24/23 136/70  12/25/22 136/87  12/15/22 114/72   Wt Readings from Last 3 Encounters:  03/24/23 152 lb (68.9 kg)  12/15/22 149 lb 1.3 oz (67.6 kg)  09/29/22 151 lb 1.9 oz (68.5 kg)    ..    12/15/2022    1:11 PM 03/25/2022   10:37 AM 03/02/2022   10:58 AM 06/24/2021    2:56 PM 03/31/2021   11:35 AM  Depression screen PHQ 2/9  Decreased Interest 0 0 0 0 0  Down, Depressed, Hopeless 0 0 0 0 0  PHQ - 2 Score 0 0 0 0 0  Altered sleeping     0  Tired, decreased energy     0  Change in appetite     0  Feeling bad or failure about yourself      0  Trouble concentrating     0  Moving slowly or fidgety/restless     0  Suicidal thoughts     0  PHQ-9 Score     0  Difficult doing work/chores     Not difficult at all     Physical Exam Constitutional:      Appearance: Normal  appearance.  HENT:     Head: Normocephalic.  Cardiovascular:     Rate and Rhythm: Normal rate and regular rhythm.  Pulmonary:     Effort: Pulmonary effort is normal.     Breath sounds: Normal breath sounds.  Musculoskeletal:     Comments: Tenderness over left lateral thigh to palpation  No swelling, redness, warmth.  NROM of left leg  Neurological:     General: No focal deficit present.     Mental Status: She is alert and oriented to person, place, and time.  Psychiatric:        Mood and Affect: Mood normal.      The 10-year ASCVD risk score (Arnett DK, et al., 2019) is: 22.2%    Assessment & Plan:  Marland KitchenMarland KitchenKhamaya was seen today for medical management of chronic issues.  Diagnoses and all orders for this visit:  Immunization due -     Flu Vaccine Trivalent High Dose (Fluad)  Left hip pain -     DG HIP UNILAT W OR W/O PELVIS 2-3 VIEWS LEFT; Future  Left leg pain -     US Venous Img Lower Unilateral  Left (DVT); Future  Screening for diabetes mellitus -     CMP14+EGFR  Screening for lipid disorders -     Lipid panel  Age-related osteoporosis without current pathological fracture -     VITAMIN D 25 Hydroxy (Vit-D Deficiency, Fractures)  Medication management -     CMP14+EGFR -     CBC w/Diff/Platelet -     TSH  Mucopurulent chronic bronchitis (HCC)  Aortic atherosclerosis (HCC) -     Lipid panel -     atorvastatin (LIPITOR) 40 MG tablet; Take 1 tablet (40 mg total) by mouth daily.  Post-menopausal -     VITAMIN D 25 Hydroxy (Vit-D Deficiency, Fractures)  Hypothyroidism, unspecified type -     TSH  Chronic obstructive pulmonary disease, unspecified COPD type (HCC) -     umeclidinium-vilanterol (ANORO ELLIPTA) 62.5-25 MCG/ACT AEPB; Inhale 1 puff into the lungs daily at 6 (six) AM.  Mixed hyperlipidemia -     atorvastatin (LIPITOR) 40 MG tablet; Take 1 tablet (40 mg total) by mouth daily.   Labs ordered for management Prolia is most effect for bone density Will get approved and have come back in for injection Anoro refilled Lipitor refilled  Concerned about left leg and DVT Stat venous doppler ordered If negative, will treat MSK Xray of hip ordered as well to look for OA   Return in about 6 months (around 09/22/2023), or if symptoms worsen or fail to improve.    Tandy Gaw, PA-C

## 2023-03-25 LAB — CBC WITH DIFFERENTIAL/PLATELET
Basophils Absolute: 0.1 10*3/uL (ref 0.0–0.2)
Basos: 1 %
EOS (ABSOLUTE): 0.3 10*3/uL (ref 0.0–0.4)
Eos: 4 %
Hematocrit: 39.4 % (ref 34.0–46.6)
Hemoglobin: 12.5 g/dL (ref 11.1–15.9)
Immature Grans (Abs): 0 10*3/uL (ref 0.0–0.1)
Immature Granulocytes: 0 %
Lymphocytes Absolute: 2.7 10*3/uL (ref 0.7–3.1)
Lymphs: 37 %
MCH: 29.5 pg (ref 26.6–33.0)
MCHC: 31.7 g/dL (ref 31.5–35.7)
MCV: 93 fL (ref 79–97)
Monocytes Absolute: 0.6 10*3/uL (ref 0.1–0.9)
Monocytes: 9 %
Neutrophils Absolute: 3.6 10*3/uL (ref 1.4–7.0)
Neutrophils: 49 %
Platelets: 224 10*3/uL (ref 150–450)
RBC: 4.24 x10E6/uL (ref 3.77–5.28)
RDW: 12.1 % (ref 11.7–15.4)
WBC: 7.3 10*3/uL (ref 3.4–10.8)

## 2023-03-25 LAB — CMP14+EGFR
ALT: 20 [IU]/L (ref 0–32)
AST: 22 [IU]/L (ref 0–40)
Albumin: 4.3 g/dL (ref 3.8–4.8)
Alkaline Phosphatase: 66 [IU]/L (ref 44–121)
BUN/Creatinine Ratio: 19 (ref 12–28)
BUN: 12 mg/dL (ref 8–27)
Bilirubin Total: 0.4 mg/dL (ref 0.0–1.2)
CO2: 28 mmol/L (ref 20–29)
Calcium: 9.5 mg/dL (ref 8.7–10.3)
Chloride: 102 mmol/L (ref 96–106)
Creatinine, Ser: 0.64 mg/dL (ref 0.57–1.00)
Globulin, Total: 2.2 g/dL (ref 1.5–4.5)
Glucose: 80 mg/dL (ref 70–99)
Potassium: 4.4 mmol/L (ref 3.5–5.2)
Sodium: 143 mmol/L (ref 134–144)
Total Protein: 6.5 g/dL (ref 6.0–8.5)
eGFR: 91 mL/min/{1.73_m2} (ref >59)

## 2023-03-25 LAB — LIPID PANEL
Chol/HDL Ratio: 2.4 ratio (ref 0.0–4.4)
Cholesterol, Total: 159 mg/dL (ref 100–199)
HDL: 65 mg/dL (ref >39)
LDL Chol Calc (NIH): 73 mg/dL (ref 0–99)
Triglycerides: 122 mg/dL (ref 0–149)
VLDL Cholesterol Cal: 21 mg/dL (ref 5–40)

## 2023-03-25 LAB — TSH: TSH: 0.888 u[IU]/mL (ref 0.450–4.500)

## 2023-03-25 LAB — VITAMIN D 25 HYDROXY (VIT D DEFICIENCY, FRACTURES): Vit D, 25-Hydroxy: 69.5 ng/mL (ref 30.0–100.0)

## 2023-03-26 ENCOUNTER — Other Ambulatory Visit: Payer: Self-pay | Admitting: Physician Assistant

## 2023-03-26 ENCOUNTER — Encounter: Payer: Self-pay | Admitting: Physician Assistant

## 2023-03-26 MED ORDER — LEVOTHYROXINE SODIUM 75 MCG PO TABS: 75.0000 ug | ORAL_TABLET | Freq: Every day | ORAL | 3 refills | Status: DC

## 2023-03-26 NOTE — Progress Notes (Signed)
Gabriela Baker,   Your vitamin D looks amazing.  Your kidney, liver, glucose look great.  Your thyroid is normal.  No anemia.  Your cholesterol looks great.   Pt does need her prolia approved and scheduled.

## 2023-03-31 NOTE — Telephone Encounter (Signed)
-----   Message from Tandy Gaw sent at 03/26/2023  8:11 AM EDT ----- Gabriela Baker,   Your vitamin D looks amazing.  Your kidney, liver, glucose look great.  Your thyroid is normal.  No anemia.  Your cholesterol looks great.   Pt does need her prolia approved and scheduled.

## 2023-04-19 ENCOUNTER — Encounter: Payer: Self-pay | Admitting: Physician Assistant

## 2023-04-19 DIAGNOSIS — M869 Osteomyelitis, unspecified: Secondary | ICD-10-CM | POA: Insufficient documentation

## 2023-04-19 NOTE — Progress Notes (Signed)
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 Inflammation in between left and right pubic bones. Targeting PT can help. Would you be ok with referral?   Anti-inflammatories can help as well. Are you taking any?

## 2024-02-21 ENCOUNTER — Ambulatory Visit (INDEPENDENT_AMBULATORY_CARE_PROVIDER_SITE_OTHER): Admitting: Physician Assistant

## 2024-02-21 VITALS — BP 117/78 | Ht 62.0 in | Wt 151.0 lb

## 2024-02-21 DIAGNOSIS — M792 Neuralgia and neuritis, unspecified: Secondary | ICD-10-CM

## 2024-02-21 DIAGNOSIS — R202 Paresthesia of skin: Secondary | ICD-10-CM | POA: Insufficient documentation

## 2024-02-21 DIAGNOSIS — R200 Anesthesia of skin: Secondary | ICD-10-CM

## 2024-02-21 NOTE — Patient Instructions (Addendum)
 Use voltaren  gel three times a day for the next week and see if calms down finger neuralgia. I do think should heal on it's own. Let me know if any changes.

## 2024-02-21 NOTE — Progress Notes (Unsigned)
   Acute Office Visit  Subjective:     Patient ID: Gabriela Baker, female    DOB: Oct 08, 1945, 78 y.o.   MRN: [id number]  No chief complaint on file.   HPI Patient with PMH of OA in the left 1st MCP is in today for intermittent pain in the pulp surface of her left thumb that has been going on for 2 weeks. She describes it as an electric shock sensation localized to the tip of the left thumb when she bumps it on things or picks things up. It doesn't happen every time, she states it will happen quite randomly. The only thing she can think of that happened to the thumb prior to the pain was her slapping a bug off the area while she was outside. She has tried applying Voltaren  gel which brought minor relief.   She states it is not painful to palpation, there is no radiation, no numbness, no changes in dexterity or strength.   ROS      Objective:    There were no vitals taken for this visit. {Vitals History (Optional):23777}  Physical Exam  No results found for any visits on 02/21/24.      Assessment & Plan:   Problem List Items Addressed This Visit   None   No orders of the defined types were placed in this encounter.   No follow-ups on file.  [address] [place], Student-PA

## 2024-02-22 ENCOUNTER — Encounter: Payer: Self-pay | Admitting: Physician Assistant

## 2024-02-22 DIAGNOSIS — M792 Neuralgia and neuritis, unspecified: Secondary | ICD-10-CM | POA: Insufficient documentation

## 2024-03-28 ENCOUNTER — Ambulatory Visit (INDEPENDENT_AMBULATORY_CARE_PROVIDER_SITE_OTHER): Admitting: Physician Assistant

## 2024-03-28 VITALS — BP 126/75 | HR 105 | Temp 98.8°F | Ht 62.0 in | Wt 154.0 lb

## 2024-03-28 DIAGNOSIS — Z131 Encounter for screening for diabetes mellitus: Secondary | ICD-10-CM

## 2024-03-28 DIAGNOSIS — E039 Hypothyroidism, unspecified: Secondary | ICD-10-CM

## 2024-03-28 DIAGNOSIS — E781 Pure hyperglyceridemia: Secondary | ICD-10-CM

## 2024-03-28 DIAGNOSIS — I700 Atherosclerosis of aorta: Secondary | ICD-10-CM

## 2024-03-28 DIAGNOSIS — Z23 Encounter for immunization: Secondary | ICD-10-CM

## 2024-03-28 DIAGNOSIS — E782 Mixed hyperlipidemia: Secondary | ICD-10-CM

## 2024-03-28 DIAGNOSIS — Z122 Encounter for screening for malignant neoplasm of respiratory organs: Secondary | ICD-10-CM

## 2024-03-28 DIAGNOSIS — Z0000 Encounter for general adult medical examination without abnormal findings: Secondary | ICD-10-CM

## 2024-03-28 DIAGNOSIS — E559 Vitamin D deficiency, unspecified: Secondary | ICD-10-CM

## 2024-03-28 DIAGNOSIS — K5904 Chronic idiopathic constipation: Secondary | ICD-10-CM

## 2024-03-28 DIAGNOSIS — J411 Mucopurulent chronic bronchitis: Secondary | ICD-10-CM

## 2024-03-28 NOTE — Progress Notes (Signed)
 Complete physical exam  Patient: Gabriela Baker   DOB: Oct 06, 1945   78 y.o. Female  MRN: [id number]  Subjective:    Chief Complaint  Patient presents with   Annual Exam   Discussed the use of AI scribe software for clinical note transcription with the patient, who gave verbal consent to proceed.  History of Present Illness Gabriela Baker is a 78 year old female who presents for a routine follow-up visit.  Medication concerns - Concerned about a recall on her cholesterol medication - Has not received notification regarding the recall and is unsure if her batch is affected  Immunization status - Due for a tetanus vaccination, which must be administered at a pharmacy due to Medicare coverage limitations  Cancer screening - No mammogram in several years - Not currently undergoing lung cancer screening - Quit smoking in 2021 after a long history of tobacco use  Constipation - Bowel movements are infrequent and difficult - Occasionally drinks prune juice - Diet includes high water intake, yogurt, salads, and beans - Not using Miralax regularly  Sleep quality - Averages ten hours of sleep per night - Feels rested upon waking  Physical activity - Recently purchased a stationary bike - Uses stationary bike every night for exercise  Cardiopulmonary symptoms - No chest pain - No shortness of breath  COPD - On Anoro daily - rarely uses albuterol  for rescue  Hypothyroidism - On synthroid   Dental care access - Has not been to the dentist recently due to lack of insurance coverage     Most recent fall risk assessment:    04/03/2024    6:42 AM  Fall Risk   Falls in the past year? 0  Number falls in past yr: 0  Injury with Fall? 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   Atopic dermatitis, mild 12/15/2022   Trigger finger, right ring finger 03/02/2022   Osteoporosis 02/25/2022   Muscle strain 02/23/2022   DDD (degenerative disc disease), lumbar 06/27/2021   Acute right-sided low back pain without sciatica 06/27/2021   Bilateral thumb pain 07/24/2020   Primary osteoarthritis of first carpometacarpal joint of left hand 04/24/2020   Elevated liver enzymes 11/22/2019   Combined forms of age-related cataract of left eye 06/20/2019   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   Former smoker 07/18/2018   Tachycardia 07/18/2018   Chronic obstructive pulmonary disease (HCC) 06/25/2016   Aortic atherosclerosis 06/25/2016   Hypertriglyceridemia 10/08/2015   Atypical nevus 10/04/2015   History of anaphylaxis 04/15/2015   Thyroid activity decreased 04/15/2015   Hyperlipidemia 04/15/2015   Osteopenia 04/15/2015   Post-menopausal 04/15/2015   Past Medical History:  Diagnosis Date   Hyperlipidemia    Thyroid disease    Family History  Problem Relation Age of Onset   Cancer Mother    Heart failure Father    Cancer  Father    Allergies  Allergen Reactions   Bee Venom Other (See Comments)      Patient Care Team: Eyan Hagood L, PA-C as PCP - General (Family Medicine)   Outpatient Medications Prior to Visit  Medication Sig   albuterol  (VENTOLIN  HFA) 108 (90 Base) MCG/ACT inhaler TAKE 2 PUFFS BY MOUTH EVERY 6 HOURS AS NEEDED FOR WHEEZE OR SHORTNESS OF BREATH   aspirin EC 81 MG tablet Take 81 mg by mouth daily.   atorvastatin  (LIPITOR) 40 MG tablet Take 1 tablet (40 mg total) by mouth daily.   Calcium   Citrate-Vitamin D  (CALCIUM  + D PO) Take by mouth daily.   diclofenac  Sodium (VOLTAREN ) 1 % GEL Apply 4 g topically 4 (four) times daily. To affected joint.   levothyroxine  (SYNTHROID ) 75 MCG tablet Take 1 tablet (75 mcg total) by mouth daily.   Multiple Vitamins-Minerals (CENTRUM SILVER PO) Take by mouth daily.   triamcinolone  cream (KENALOG ) 0.1 % Apply 1 Application topically 2 (two) times daily.   umeclidinium-vilanterol (ANORO ELLIPTA ) 62.5-25 MCG/ACT AEPB Inhale 1 puff into the lungs daily at 6 (six) AM.   No facility-administered medications prior to visit.    ROS See HPI.  Objective:     BP 126/75   Pulse (!) 105   Temp 98.8 F (37.1 C) (Oral)   Ht 5' 2 (1.575 m)   Wt 154 lb (69.9 kg)   SpO2 95%   BMI 28.17 kg/m  BP Readings from Last 3 Encounters:  03/28/24 126/75  02/21/24 117/78  03/24/23 136/70   Wt Readings from Last 3 Encounters:  03/28/24 154 lb (69.9 kg)  02/21/24 151 lb (68.5 kg)  03/24/23 152 lb (68.9 kg)      Physical Exam  BP 126/75   Pulse (!) 105   Temp 98.8 F (37.1 C) (Oral)   Ht 5' 2 (1.575 m)   Wt 154 lb (69.9 kg)   SpO2 95%   BMI 28.17 kg/m   General Appearance:    Alert, cooperative, no distress, appears stated age  Head:    Normocephalic, without obvious abnormality, atraumatic  Eyes:    PERRL, conjunctiva/corneas clear, EOM's intact, fundi    benign, both eyes  Ears:    Normal TM's and external ear canals, both ears  Nose:   Nares normal, septum midline, mucosa normal, no drainage    or sinus tenderness  Throat:   Lips, mucosa, and tongue normal; poor dentition. Missing numerous teeth.   Neck:   Supple, symmetrical, trachea midline, no adenopathy;    thyroid:  no enlargement/tenderness/nodules; no carotid   bruit or JVD  Back:     Symmetric, no curvature, ROM normal, no CVA tenderness  Lungs:     Clear to auscultation bilaterally, respirations unlabored  Chest Wall:    No tenderness or deformity   Heart:    Regular rate and  rhythm, S1 and S2 normal, no murmur, rub   or gallop     Abdomen:     Soft, non-tender, bowel sounds active all four quadrants,    no masses, no organomegaly        Extremities:   Extremities normal, atraumatic, no cyanosis or edema  Pulses:   2+ and symmetric all extremities  Skin:   Skin color, texture, turgor normal, no rashes or lesions  Lymph nodes:   Cervical, supraclavicular, and axillary nodes normal  Neurologic:   CNII-XII intact, normal strength, sensation and reflexes    throughout  Assessment & Plan:    Routine Health Maintenance and Physical Exam  Immunization History  Administered Date(s) Administered   Fluad Quad(high Dose 65+) 03/07/2019, 03/29/2020, 02/23/2022   Fluad Trivalent(High Dose 65+) 03/24/2023   INFLUENZA, HIGH DOSE SEASONAL PF 05/05/2016, 03/05/2017, 03/10/2019, 03/29/2024   Influenza,inj,Quad PF,6+ Mos 04/15/2015, 02/28/2018   Influenza-Unspecified 03/21/2021   Moderna SARS-COV2 Booster Vaccination 03/21/2021   Moderna Sars-Covid-2 Vaccination 09/22/2019, 10/20/2019, 04/15/2020   Pfizer(Comirnaty)Fall Seasonal Vaccine 12 years and older 03/09/2022   Pneumococcal Conjugate-13 04/15/2015   Pneumococcal Polysaccharide-23 03/05/2017   Tdap 06/12/2013   Zoster Recombinant(Shingrix) 06/27/2020, 08/27/2020    Health Maintenance  Topic Date Due   Lung Cancer Screening  06/25/2017   Medicare Annual Wellness (AWV)  03/29/2021   DTaP/Tdap/Td (2 - Td or Tdap) 06/13/2023   COVID-19 Vaccine (5 - 2025-26 season) 01/31/2024   Mammogram  03/28/2025 (Originally 01/05/2012)   Pneumococcal Vaccine: 50+ Years  Completed   Influenza Vaccine  Completed   DEXA SCAN  Completed   Hepatitis C Screening  Completed   Zoster Vaccines- Shingrix  Completed   Meningococcal B Vaccine  Aged Out   Fecal DNA (Cologuard)  Discontinued    Discussed health benefits of physical activity, and encouraged her to engage in regular exercise appropriate for her age and  condition. SABRAEmma was seen today for annual exam.  Diagnoses and all orders for this visit:  Routine physical examination -     CMP14+EGFR -     Lipid panel -     VITAMIN D  25 Hydroxy (Vit-D Deficiency, Fractures) -     Vitamin B12 -     TSH + free T4  Aortic atherosclerosis -     Lipid panel  Mixed hyperlipidemia -     Lipid panel  Hypertriglyceridemia  Screening for diabetes mellitus -     CMP14+EGFR  Vitamin D  deficiency -     VITAMIN D  25 Hydroxy (Vit-D Deficiency, Fractures)  Acquired hypothyroidism -     TSH + free T4  Screening for lung cancer -     Ambulatory Referral Lung Cancer Screening [facility]  Need for influenza vaccination -     Flu vaccine HIGH DOSE PF(Fluzone Trivalent)  Chronic idiopathic constipation  Other orders -     Cancel: Tdap vaccine greater than or equal to 7yo IM    Assessment & Plan Adult Wellness Visit Blood pressure controlled. Sleep slightly above average but not concerning. Mood normal. No recent mammogram or dental visits. Regular eye exams. No recent lung cancer screening despite smoking history. Medicare covers lung cancer screening CT scan. - Administer flu and COVID vaccines. - Order lung cancer screening CT scan. - Advise obtaining tetanus vaccine at pharmacy. - Encourage dental visits if possible. - fasting labs ordered today  Mixed hyperlipidemia No recall noted for her cholesterol medication batch. Pharmacy will alert if recall affects her medication. - Send message to pharmacy to confirm status of medication recall.  Hypothyroidism - labs ordered to screen TSH and adjust medications accordingly  Constipation Reports difficulty with bowel movements. Occasionally uses prune juice with benefit. High water intake and diet includes yogurt, salads, and beans. - Recommend using Miralax with juice in the morning, starting with a fourth to half a capful.    Return in about 6 months (around 09/26/2024).      Pearlie Lafosse, PA-C

## 2024-03-28 NOTE — Patient Instructions (Addendum)
 Get Tdap at pharmacy.  Flu and Covid vaccine given today.  Get fasting labs.   Health Maintenance After Age 79 After age 62, you are at a higher risk for certain long-term diseases and infections as well as injuries from falls. Falls are a major cause of broken bones and head injuries in people who are older than age 100. Getting regular preventive care can help to keep you healthy and well. Preventive care includes getting regular testing and making lifestyle changes as recommended by your health care provider. Talk with your health care provider about: Which screenings and tests you should have. A screening is a test that checks for a disease when you have no symptoms. A diet and exercise plan that is right for you. What should I know about screenings and tests to prevent falls? Screening and testing are the best ways to find a health problem early. Early diagnosis and treatment give you the best chance of managing medical conditions that are common after age 7. Certain conditions and lifestyle choices may make you more likely to have a fall. Your health care provider may recommend: Regular vision checks. Poor vision and conditions such as cataracts can make you more likely to have a fall. If you wear glasses, make sure to get your prescription updated if your vision changes. Medicine review. Work with your health care provider to regularly review all of the medicines you are taking, including over-the-counter medicines. Ask your health care provider about any side effects that may make you more likely to have a fall. Tell your health care provider if any medicines that you take make you feel dizzy or sleepy. Strength and balance checks. Your health care provider may recommend certain tests to check your strength and balance while standing, walking, or changing positions. Foot health exam. Foot pain and numbness, as well as not wearing proper footwear, can make you more likely to have a  fall. Screenings, including: Osteoporosis screening. Osteoporosis is a condition that causes the bones to get weaker and break more easily. Blood pressure screening. Blood pressure changes and medicines to control blood pressure can make you feel dizzy. Depression screening. You may be more likely to have a fall if you have a fear of falling, feel depressed, or feel unable to do activities that you used to do. Alcohol use screening. Using too much alcohol can affect your balance and may make you more likely to have a fall. Follow these instructions at home: Lifestyle Do not drink alcohol if: Your health care provider tells you not to drink. If you drink alcohol: Limit how much you have to: 0-1 drink a day for women. 0-2 drinks a day for men. Know how much alcohol is in your drink. In the [place] one drink equals one 12 oz bottle of beer (355 mL), one 5 oz glass of wine (148 mL), or one 1 oz glass of hard liquor (44 mL). Do not use any products that contain nicotine  or tobacco. These products include cigarettes, chewing tobacco, and vaping devices, such as e-cigarettes. If you need help quitting, ask your health care provider. Activity  Follow a regular exercise program to stay fit. This will help you maintain your balance. Ask your health care provider what types of exercise are appropriate for you. If you need a cane or walker, use it as recommended by your health care provider. Wear supportive shoes that have nonskid soles. Safety  Remove any tripping hazards, such as rugs, cords, and clutter. Install  safety equipment such as grab bars in bathrooms and safety rails on stairs. Keep rooms and walkways well-lit. General instructions Talk with your health care provider about your risks for falling. Tell your health care provider if: You fall. Be sure to tell your health care provider about all falls, even ones that seem minor. You feel dizzy, tiredness (fatigue), or off-balance. Take  over-the-counter and prescription medicines only as told by your health care provider. These include supplements. Eat a healthy diet and maintain a healthy weight. A healthy diet includes low-fat dairy products, low-fat (lean) meats, and fiber from whole grains, beans, and lots of fruits and vegetables. Stay current with your vaccines. Schedule regular health, dental, and eye exams. Summary Having a healthy lifestyle and getting preventive care can help to protect your health and wellness after age 7. Screening and testing are the best way to find a health problem early and help you avoid having a fall. Early diagnosis and treatment give you the best chance for managing medical conditions that are more common for people who are older than age 68. Falls are a major cause of broken bones and head injuries in people who are older than age 24. Take precautions to prevent a fall at home. Work with your health care provider to learn what changes you can make to improve your health and wellness and to prevent falls. This information is not intended to replace advice given to you by your health care provider. Make sure you discuss any questions you have with your health care provider. Document Revised: 10/07/2020 Document Reviewed: 10/07/2020 Elsevier Patient Education  2024 [employer].

## 2024-03-29 ENCOUNTER — Ambulatory Visit: Payer: Self-pay | Admitting: Physician Assistant

## 2024-03-29 DIAGNOSIS — K5904 Chronic idiopathic constipation: Secondary | ICD-10-CM | POA: Insufficient documentation

## 2024-03-29 DIAGNOSIS — Z23 Encounter for immunization: Secondary | ICD-10-CM

## 2024-03-29 LAB — CMP14+EGFR
ALT: 31 IU/L (ref 0–32)
AST: 28 IU/L (ref 0–40)
Albumin: 4.2 g/dL (ref 3.8–4.8)
Alkaline Phosphatase: 98 IU/L (ref 49–135)
BUN/Creatinine Ratio: 13 (ref 12–28)
BUN: 9 mg/dL (ref 8–27)
Bilirubin Total: 0.5 mg/dL (ref 0.0–1.2)
CO2: 26 mmol/L (ref 20–29)
Calcium: 9.9 mg/dL (ref 8.7–10.3)
Chloride: 102 mmol/L (ref 96–106)
Creatinine, Ser: 0.72 mg/dL (ref 0.57–1.00)
Globulin, Total: 2.4 g/dL (ref 1.5–4.5)
Glucose: 90 mg/dL (ref 70–99)
Potassium: 4.5 mmol/L (ref 3.5–5.2)
Sodium: 142 mmol/L (ref 134–144)
Total Protein: 6.6 g/dL (ref 6.0–8.5)
eGFR: 86 mL/min/1.73 (ref >59)

## 2024-03-29 LAB — TSH+FREE T4
Free T4: 1.16 ng/dL (ref 0.82–1.77)
TSH: 0.312 u[IU]/mL — ABNORMAL LOW (ref 0.450–4.500)

## 2024-03-29 LAB — LIPID PANEL
Chol/HDL Ratio: 3.3 ratio (ref 0.0–4.4)
Cholesterol, Total: 190 mg/dL (ref 100–199)
HDL: 58 mg/dL (ref >39)
LDL Chol Calc (NIH): 95 mg/dL (ref 0–99)
Triglycerides: 217 mg/dL — ABNORMAL HIGH (ref 0–149)
VLDL Cholesterol Cal: 37 mg/dL (ref 5–40)

## 2024-03-29 LAB — VITAMIN B12: Vitamin B-12: 921 pg/mL (ref 232–1245)

## 2024-03-29 LAB — VITAMIN D 25 HYDROXY (VIT D DEFICIENCY, FRACTURES): Vit D, 25-Hydroxy: 80.2 ng/mL (ref 30.0–100.0)

## 2024-03-29 NOTE — Progress Notes (Signed)
 Gabriela Baker,   Your TSH low but free T4 looks good. Stay on same dose for now and then   Kidney, liver, glucose looks good.  Vitamin D  looks great.  TG a little elevated. Limit sugars and carbs.  Cholesterol looks great.

## 2024-04-03 ENCOUNTER — Encounter: Payer: Self-pay | Admitting: Physician Assistant

## 2024-04-03 DIAGNOSIS — Z23 Encounter for immunization: Secondary | ICD-10-CM | POA: Diagnosis not present

## 2024-04-03 NOTE — Addendum Note (Signed)
 Addended by: Aprill Banko A on: 04/03/2024 09:31 AM   Modules accepted: Orders

## 2024-04-17 ENCOUNTER — Other Ambulatory Visit: Payer: Self-pay | Admitting: Physician Assistant

## 2024-04-17 DIAGNOSIS — I700 Atherosclerosis of aorta: Secondary | ICD-10-CM

## 2024-04-17 DIAGNOSIS — E782 Mixed hyperlipidemia: Secondary | ICD-10-CM

## 2024-04-19 ENCOUNTER — Encounter: Payer: Self-pay | Admitting: Physician Assistant

## 2024-04-19 DIAGNOSIS — E782 Mixed hyperlipidemia: Secondary | ICD-10-CM

## 2024-04-19 DIAGNOSIS — I700 Atherosclerosis of aorta: Secondary | ICD-10-CM

## 2024-04-19 MED ORDER — ATORVASTATIN CALCIUM 40 MG PO TABS: 40.0000 mg | ORAL_TABLET | Freq: Every day | ORAL | 3 refills | Status: DC

## 2024-04-19 MED ORDER — LEVOTHYROXINE SODIUM 75 MCG PO TABS: 75.0000 ug | ORAL_TABLET | Freq: Every day | ORAL | 3 refills | Status: AC

## 2024-04-19 NOTE — Telephone Encounter (Signed)
 I do not know why these medications would be denied -   Sent for your review.  Last written 03/26/2023 Last OV 03/28/2024 Labs drawn for lipid panel 03/28/2024 Upcoming appt = none

## 2024-04-20 ENCOUNTER — Encounter: Payer: Self-pay | Admitting: Physician Assistant

## 2024-04-24 ENCOUNTER — Encounter: Payer: Self-pay | Admitting: Physician Assistant

## 2024-04-24 DIAGNOSIS — E782 Mixed hyperlipidemia: Secondary | ICD-10-CM

## 2024-04-24 DIAGNOSIS — I700 Atherosclerosis of aorta: Secondary | ICD-10-CM

## 2024-04-24 MED ORDER — ATORVASTATIN CALCIUM 40 MG PO TABS: 40.0000 mg | ORAL_TABLET | Freq: Every day | ORAL | 3 refills | Status: AC
